# Patient Record
Sex: Female | Born: 1950
Health system: Southern US, Community
[De-identification: ages and names within clinical notes are randomized; demographics above are authoritative.]

## PROBLEM LIST (undated history)

## (undated) DIAGNOSIS — H269 Unspecified cataract: Secondary | ICD-10-CM

## (undated) DIAGNOSIS — E119 Type 2 diabetes mellitus without complications: Secondary | ICD-10-CM

## (undated) DIAGNOSIS — M1990 Unspecified osteoarthritis, unspecified site: Secondary | ICD-10-CM

## (undated) DIAGNOSIS — F32A Depression, unspecified: Secondary | ICD-10-CM

## (undated) DIAGNOSIS — E785 Hyperlipidemia, unspecified: Secondary | ICD-10-CM

## (undated) DIAGNOSIS — I10 Essential (primary) hypertension: Secondary | ICD-10-CM

## (undated) DIAGNOSIS — F329 Major depressive disorder, single episode, unspecified: Secondary | ICD-10-CM

## (undated) HISTORY — DX: Unspecified osteoarthritis, unspecified site: M19.90

## (undated) HISTORY — DX: Essential (primary) hypertension: I10

## (undated) HISTORY — DX: Depression, unspecified: F32.A

## (undated) HISTORY — DX: Major depressive disorder, single episode, unspecified: F32.9

## (undated) HISTORY — DX: Unspecified cataract: H26.9

## (undated) HISTORY — DX: Type 2 diabetes mellitus without complications: E11.9

## (undated) HISTORY — DX: Hyperlipidemia, unspecified: E78.5

---

## 1998-03-19 ENCOUNTER — Emergency Department (HOSPITAL_COMMUNITY): Admission: EM | Admit: 1998-03-19 | Discharge: 1998-03-19 | Payer: Self-pay | Admitting: Emergency Medicine

## 1999-12-13 ENCOUNTER — Encounter: Admission: RE | Admit: 1999-12-13 | Discharge: 1999-12-13 | Payer: Self-pay | Admitting: Surgery

## 1999-12-13 ENCOUNTER — Encounter: Payer: Self-pay | Admitting: Surgery

## 2000-09-29 ENCOUNTER — Encounter: Admission: RE | Admit: 2000-09-29 | Discharge: 2000-09-29 | Payer: Self-pay | Admitting: Family Medicine

## 2000-09-29 ENCOUNTER — Encounter: Payer: Self-pay | Admitting: Family Medicine

## 2000-11-21 ENCOUNTER — Other Ambulatory Visit: Admission: RE | Admit: 2000-11-21 | Discharge: 2000-11-21 | Payer: Self-pay | Admitting: Family Medicine

## 2000-11-24 ENCOUNTER — Encounter: Payer: Self-pay | Admitting: Family Medicine

## 2000-11-24 ENCOUNTER — Encounter: Admission: RE | Admit: 2000-11-24 | Discharge: 2000-11-24 | Payer: Self-pay | Admitting: Family Medicine

## 2001-01-14 ENCOUNTER — Ambulatory Visit (HOSPITAL_COMMUNITY): Admission: RE | Admit: 2001-01-14 | Discharge: 2001-01-14 | Payer: Self-pay | Admitting: Gastroenterology

## 2001-01-14 ENCOUNTER — Encounter (INDEPENDENT_AMBULATORY_CARE_PROVIDER_SITE_OTHER): Payer: Self-pay | Admitting: Specialist

## 2001-01-27 ENCOUNTER — Encounter: Payer: Self-pay | Admitting: Gastroenterology

## 2001-01-27 ENCOUNTER — Encounter: Admission: RE | Admit: 2001-01-27 | Discharge: 2001-01-27 | Payer: Self-pay | Admitting: Gastroenterology

## 2001-02-03 ENCOUNTER — Encounter: Payer: Self-pay | Admitting: Gastroenterology

## 2001-02-03 ENCOUNTER — Encounter: Admission: RE | Admit: 2001-02-03 | Discharge: 2001-02-03 | Payer: Self-pay | Admitting: Gastroenterology

## 2002-01-19 ENCOUNTER — Encounter: Payer: Self-pay | Admitting: Family Medicine

## 2002-01-19 ENCOUNTER — Encounter: Admission: RE | Admit: 2002-01-19 | Discharge: 2002-01-19 | Payer: Self-pay | Admitting: Family Medicine

## 2004-12-24 ENCOUNTER — Other Ambulatory Visit: Admission: RE | Admit: 2004-12-24 | Discharge: 2004-12-24 | Payer: Self-pay | Admitting: Family Medicine

## 2005-04-16 ENCOUNTER — Encounter: Admission: RE | Admit: 2005-04-16 | Discharge: 2005-04-16 | Payer: Self-pay | Admitting: Family Medicine

## 2008-12-27 ENCOUNTER — Encounter: Admission: RE | Admit: 2008-12-27 | Discharge: 2008-12-27 | Payer: Self-pay | Admitting: General Practice

## 2011-04-26 NOTE — Procedures (Signed)
[place]. [facility]  Patient:    Rachael Lane, Rachael Lane                       MRN: [id number] Proc. Date: 01/14/01 Adm. Date:  [id number] Attending:  Charna Elizabeth CC:         Talmadge Coventry, M.D.   Procedure Report  DATE OF BIRTH:  August 29, 1951.  REFERRING PHYSICIAN:  Talmadge Coventry, M.D.  PROCEDURE PERFORMED:  Esophagogastroduodenoscopy with biopsies.  ENDOSCOPIST:  Anselmo Rod, M.D.  INSTRUMENT USED:  Olympus video panendoscope.  INDICATIONS FOR PROCEDURE:  Epigastric pain with abnormal weight loss and guaiac positive stools in a 60 year old white female, rule out peptic ulcer disease, esophagitis, gastritis, etc.  PREPROCEDURE PREPARATION:  Informed consent was procured from the patient. The patient was fasted for eight hours prior to the procedure.  PREPROCEDURE PHYSICAL:  The patient had stable vital signs.  Neck supple. Chest clear to auscultation.  S1, S2 regular.  Abdomen soft with normal abdominal bowel sounds.  Epigastric tenderness on palpation with no rebound or rigidity, minimal guarding present.  DESCRIPTION OF PROCEDURE:  The patient was placed in left lateral decubitus position and sedated with 80 mg of Demerol and 10 mg of Versed intravenously. Once the patient was adequately sedated and maintained on low-flow oxygen and continuous cardiac monitoring, the Olympus video panendoscope was advanced through the mouthpiece, over the tongue, into the esophagus under direct vision.  The entire esophagus appeared normal without evidence of ring, stricture, masses, lesions, esophagitis or Barretts mucosa.  The scope was then advanced to the stomach.  There were edematous gastric folds that were biopsied for pathology.  There was patchy hemorrhagic gastritis throughout the gastric mucosa that was noticed, especially in the proximal half of the stomach.  This area was also biopsied to rule out presence of Helicobacter pylori by  pathology.  The duodenal bulb and small bowel distal to the bulb appeared normal.  IMPRESSION: 1. Normal-appearing esophagus and proximal small bowel. 2. Edematous gastric folds with hemorrhagic gastritis in patches.  Biopsies    done, results pending.  RECOMMENDATION: 1. Await pathology results. 2. Proceed with colonoscopy at this time. 3. Outpatient follow-up in the next four weeks. DD:  01/14/01 TD:  01/15/01 Job: [id number]

## 2011-04-26 NOTE — Procedures (Signed)
[place]. [facility]  Patient:    CHASE, KNEBEL                       MRN: [id number] Proc. Date: 01/14/01 Adm. Date:  [id number] Attending:  Charna Elizabeth CC:         Talmadge Coventry, M.D.   Procedure Report  DATE OF BIRTH: 16-Apr-1951  REFERRING PHYSICIAN:  Talmadge Coventry, M.D.  PROCEDURE PERFORMED:  Colonoscopy with snare polypectomy x  1.  ENDOSCOPIST:  Anselmo Rod, M.D.  INSTRUMENT USED:  Olympus video colonoscope.  INDICATIONS FOR PROCEDURE:  Abnormal weight loss with guaiac positive stools in a 60 year old white female, rule out colonic polyps, masses, hemorrhoids, etc.  PREPROCEDURE PREPARATION:  Informed consent was procured from the patient. The patient was fasted for eight hours prior to the procedure and prepped with a bottle of magnesium citrate and a gallon of NuLytely the night prior to the procedure.  PREPROCEDURE PHYSICAL:  The patient had stable vital signs.  Neck supple. Chest clear to auscultation.  S1, S2 regular.  Abdomen soft with normal abdominal bowel sounds.  DESCRIPTION OF PROCEDURE:  The patient was placed in the left lateral decubitus position and sedated with 20 mg of Demerol in addition to the sedation she had received for her EGD.  Once the patient was adequately positioned, the Olympus video colonoscope was advanced from the rectum to the cecum with slight difficulty secondary to a large amount of residual stool in the colon.  Multiple washes were done.  One small polyp was snared from 40 cm. The patient also had small nonbleeding internal hemorrhoids.  No large masses or polyps were seen.  There was no evidence of diverticulosis.  Small lesions may have been missed secondary to the inadequate prep.  IMPRESSION: 1. Small polyp snared at 40 cm. 2. Small nonbleeding internal hemorrhoid. 3. Large amount of residual stool in the colon.  A very small lesion may have    been missed.  No large masses  or polyps present.  RECOMMENDATIONS: 1. Await pathology results.  Avoid all nonsteroidals for now.  Follow up    on CT scan results of the abdomen and pelvis. 2. Small bowel follow-through to complete the GI evaluation. 3. Outpatient follow-up in the next four weeks.DD:  01/14/01 TD:  01/15/01 Job: [id number]

## 2013-09-30 ENCOUNTER — Ambulatory Visit (INDEPENDENT_AMBULATORY_CARE_PROVIDER_SITE_OTHER): Payer: Medicare HMO

## 2013-09-30 ENCOUNTER — Encounter (INDEPENDENT_AMBULATORY_CARE_PROVIDER_SITE_OTHER): Payer: Self-pay

## 2013-09-30 VITALS — BP 125/81 | HR 74 | Resp 16

## 2013-09-30 DIAGNOSIS — B351 Tinea unguium: Secondary | ICD-10-CM

## 2013-09-30 DIAGNOSIS — E119 Type 2 diabetes mellitus without complications: Secondary | ICD-10-CM

## 2013-09-30 DIAGNOSIS — B353 Tinea pedis: Secondary | ICD-10-CM

## 2013-09-30 MED ORDER — CLOTRIMAZOLE 1 % EX CREA: TOPICAL_CREAM | Freq: Two times a day (BID) | CUTANEOUS | Status: DC

## 2013-09-30 NOTE — Progress Notes (Signed)
  Subjective:    Patient ID: Rachael Lane. Epling, female    DOB: [birth year], 62 y.o.   MRN: [id number] Feet have been hurting for two months and they are cracking and sore and peeling  And has been going all summer long and burns and throbs and numbess and tingling and change temperatures and my nail on big toenail is discolored and long HPI patient has recently diagnosed diabetes taking oral medications. However for the last month does have burning stinging with a moccasin distribution of the macular rash plantar aspect of both feet. Patient also has yellowing and discoloration and arthrosis both hallux nails. Lesser nails also shows yellowing nonpainful or symptomatic    Review of Systems  Constitutional: Negative.   HENT: Negative.   Respiratory: Negative.   Cardiovascular: Negative.   Gastrointestinal: Negative.   Endocrine: Negative.   Genitourinary: Negative.   Neurological: Negative.   Hematological: Negative.        Objective:   Physical Exam  Constitutional: She is oriented to person, place, and time. She appears well-developed and well-nourished.  Cardiovascular:  Pulses:      Dorsalis pedis pulses are 2+ on the right side, and 2+ on the left side.       Posterior tibial pulses are 2+ on the right side, and 2+ on the left side.  Capillary refill time 3 seconds all digits. Skin temperature warm bilateral. No rubor or varicosities noted.  Musculoskeletal:  Rectus foot type bilateral mild digital contractures 2 through 5.  Neurological: She is alert and oriented to person, place, and time. She has normal strength and normal reflexes.  Epicritic and proprioceptive sensations intact and symmetric bilateral. Normal plantar response. Patient describing some burning paresthesias in both feet also associated with a macular rash and my suspicion of both feet.  Skin: Skin is warm and dry. No cyanosis. Nails show no clubbing.  Skin color and pigment normal hair growth present but diminished  distally nails both hallux thick and criptotic discolored yellow with lysis from the nailbed remaining nails yellowed in discolored. The macular rash in a moccasin distribution with pruritus x1 month history.  Psychiatric: She has a normal mood and affect. Her behavior is normal.          Assessment & Plan:  Assessment diabetes yearly diagnosis with likely no neuropathy or angiopathy associated at this time. There is onychomycosis affecting the nails in particular both hallux with thickening kyphosis discoloration. There is also tinea pedis a moccasin distribution bilateral.  Recommend topical formula 3 to be applied twice daily to the affected nails as instructed. Prescription for clotrimazole cream twice daily for a one-month duration with refills. Recheck in 2-3 months for follow or on an as-needed basis if it fails to improve. Dispensed literature about diabetic foot care as well may recommendations appropriate about appropriate diabetic shoe wear.  Alvan Dame DP

## 2013-09-30 NOTE — Patient Instructions (Signed)
Onychomycosis/Fungal Toenails  WHAT IS IT? An infection that lies within the keratin of your nail plate that is caused by a fungus.  WHY ME? Fungal infections affect all ages, sexes, races, and creeds.  There may be many factors that predispose you to a fungal infection such as age, coexisting medical conditions such as diabetes, or an autoimmune disease; stress, medications, fatigue, genetics, etc.  Bottom line: fungus thrives in a warm, moist environment and your shoes offer such a location.  IS IT CONTAGIOUS? Theoretically, yes.  You do not want to share shoes, nail clippers or files with someone who has fungal toenails.  Walking around barefoot in the same room or sleeping in the same bed is unlikely to transfer the organism.  It is important to realize, however, that fungus can spread easily from one nail to the next on the same foot.  HOW DO WE TREAT THIS?  There are several ways to treat this condition.  Treatment may depend on many factors such as age, medications, pregnancy, liver and kidney conditions, etc.  It is best to ask your doctor which options are available to you.  1. No treatment.   Unlike many other medical concerns, you can live with this condition.  However for many people this can be a painful condition and may lead to ingrown toenails or a bacterial infection.  It is recommended that you keep the nails cut short to help reduce the amount of fungal nail. 2. Topical treatment.  These range from herbal remedies to prescription strength nail lacquers.  About 40-50% effective, topicals require twice daily application for approximately 9 to 12 months or until an entirely new nail has grown out.  The most effective topicals are medical grade medications available through physicians offices. 3. Oral antifungal medications.  With an 80-90% cure rate, the most common oral medication requires 3 to 4 months of therapy and stays in your system for a year as the new nail grows out.  Oral  antifungal medications do require blood work to make sure it is a safe drug for you.  A liver function panel will be performed prior to starting the medication and after the first month of treatment.  It is important to have the blood work performed to avoid any harmful side effects.  In general, this medication safe but blood work is required. 4. Laser Therapy.  This treatment is performed by applying a specialized laser to the affected nail plate.  This therapy is noninvasive, fast, and non-painful.  It is not covered by insurance and is therefore, out of pocket.  The results have been very good with a 80-95% cure rate.  The [facility] is the only practice in the area to offer this therapy. Permanent Nail Avulsion.  Removing the entire nail so that a new nail will not grow back     .Diabetes and Foot Care Diabetes may cause you to have a poor blood supply (circulation) to your legs and feet. Because of this, the skin may be thinner, break easier, and heal more slowly. You also may have nerve damage in your legs and feet causing decreased feeling. You may not notice minor injuries to your feet that could lead to serious problems or infections. Taking care of your feet is one of the most important things you can do for yourself.  HOME CARE INSTRUCTIONS  Do not go barefoot. Bare feet are easily injured.  Check your feet daily for blisters, cuts, and redness.    Wash your feet with warm water (not hot) and mild soap. Pat your feet and between your toes until completely dry.  Apply a moisturizing lotion that does not contain alcohol or petroleum jelly to the dry skin on your feet and to dry brittle toenails. Do not put it between your toes.  Trim your toenails straight across. Do not dig under them or around the cuticle.  Do not cut corns or calluses, or try to remove them with medicine.  Wear clean cotton socks or stockings every day. Make sure they are not too tight. Do not wear knee high  stockings since they may decrease blood flow to your legs.  Wear leather shoes that fit properly and have enough cushioning. To break in new shoes, wear them just a few hours a day to avoid injuring your feet.  Wear shoes at all times, even in the house.  Do not cross your legs. This may decrease the blood flow to your feet.  If you find a minor scrape, cut, or break in the skin on your feet, keep it and the skin around it clean and dry. These areas may be cleansed with mild soap and water. Do not use peroxide, alcohol, iodine or Merthiolate.  When you remove an adhesive bandage, be sure not to harm the skin around it.  If you have a wound, look at it several times a day to make sure it is healing.  Do not use heating pads or hot water bottles. Burns can occur. If you have lost feeling in your feet or legs, you may not know it is happening until it is too late.  Report any cuts, sores or bruises to your caregiver. Do not wait! SEEK MEDICAL CARE IF:   You have an injury that is not healing or you notice redness, numbness, burning, or tingling.  Your feet always feel cold.  You have pain or cramps in your legs and feet. SEEK IMMEDIATE MEDICAL CARE IF:   There is increasing redness, swelling, or increasing pain in the wound.  There is a red line that goes up your leg.  Pus is coming from a wound.  You develop an unexplained oral temperature above 102 F (38.9 C), or as your caregiver suggests.  You notice a bad smell coming from an ulcer or wound. MAKE SURE YOU:   Understand these instructions.  Will watch your condition.  Will get help right away if you are not doing well or get worse. Document Released: 11/22/2000 Document Revised: 02/17/2012 Document Reviewed: 05/31/2009 ExitCare Patient Information 2014 [employer].  

## 2013-10-13 ENCOUNTER — Telehealth: Payer: Self-pay | Admitting: *Deleted

## 2013-10-13 NOTE — Telephone Encounter (Signed)
Rachael Lane states the rx of 09/30/2013 was not at the pharmacy.  I left message that I would send it electronically, and please let me know if she got it.  REcheck pt's pharmacy name and none was listed, I left 2nd message to call with the pharmacy name and phone number.  I apologized.

## 2013-10-14 MED ORDER — CLOTRIMAZOLE 1 % EX CREA: TOPICAL_CREAM | Freq: Two times a day (BID) | CUTANEOUS | Status: DC

## 2015-06-07 ENCOUNTER — Encounter: Payer: Self-pay | Admitting: Podiatry

## 2015-06-07 ENCOUNTER — Ambulatory Visit (INDEPENDENT_AMBULATORY_CARE_PROVIDER_SITE_OTHER): Payer: Commercial Managed Care - HMO | Admitting: Podiatry

## 2015-06-07 VITALS — BP 121/83 | HR 68 | Resp 18

## 2015-06-07 DIAGNOSIS — E119 Type 2 diabetes mellitus without complications: Secondary | ICD-10-CM | POA: Diagnosis not present

## 2015-06-07 DIAGNOSIS — B351 Tinea unguium: Secondary | ICD-10-CM | POA: Diagnosis not present

## 2015-06-07 NOTE — Progress Notes (Signed)
Patient ID: Rachael Lane, female   DOB: Jan 14, 1951, 64 y.o.   MRN: [id number] Complaint:  Visit Type: Patient returns to my office for continued preventative foot care services. Complaint: Patient states" my nails have grown long and thick and become painful to walk and wear shoes" Patient has been diagnosed with DM with no complications. He presents for preventative foot care services. No changes to ROS  Podiatric Exam: Vascular: dorsalis pedis and posterior tibial pulses are palpable bilateral. Capillary return is immediate. Temperature gradient is WNL. Skin turgor WNL  Sensorium: Normal Semmes Weinstein monofilament test. Normal tactile sensation bilaterally. Nail Exam: Pt has thick disfigured discolored nails with subungual debris noted bilateral entire nail hallux through fifth toenails Ulcer Exam: There is no evidence of ulcer or pre-ulcerative changes or infection. Orthopedic Exam: Muscle tone and strength are WNL. No limitations in general ROM. No crepitus or effusions noted. Foot type and digits show no abnormalities. Bony prominences are unremarkable. Skin: No Porokeratosis. No infection or ulcers.  Dry scaly fizzuring skin heels both feet.  Diagnosis:  Tinea unguium, Pain in right toe, pain in left toes  Treatment & Plan Procedures and Treatment: Consent by patient was obtained for treatment procedures. The patient understood the discussion of treatment and procedures well. All questions were answered thoroughly reviewed. Debridement of mycotic and hypertrophic toenails, 1 through 5 bilateral and clearing of subungual debris. No ulceration, no infection noted.  Return Visit-Office Procedure: Patient instructed to return to the office for a follow up visit 3 months for continued evaluation and treatment. Told her to continue with vaseline.  Told her to pick up 3/4 spenco orthoses for forefoot cushion for her feet.

## 2015-09-13 ENCOUNTER — Ambulatory Visit: Payer: Commercial Managed Care - HMO | Admitting: Sports Medicine

## 2016-01-05 DIAGNOSIS — Z1231 Encounter for screening mammogram for malignant neoplasm of breast: Secondary | ICD-10-CM | POA: Diagnosis not present

## 2016-03-05 DIAGNOSIS — F3342 Major depressive disorder, recurrent, in full remission: Secondary | ICD-10-CM | POA: Diagnosis not present

## 2016-04-22 DIAGNOSIS — E669 Obesity, unspecified: Secondary | ICD-10-CM | POA: Diagnosis not present

## 2016-04-22 DIAGNOSIS — Z6831 Body mass index (BMI) 31.0-31.9, adult: Secondary | ICD-10-CM | POA: Diagnosis not present

## 2016-04-22 DIAGNOSIS — R300 Dysuria: Secondary | ICD-10-CM | POA: Diagnosis not present

## 2016-04-22 DIAGNOSIS — R42 Dizziness and giddiness: Secondary | ICD-10-CM | POA: Diagnosis not present

## 2016-04-22 DIAGNOSIS — N390 Urinary tract infection, site not specified: Secondary | ICD-10-CM | POA: Diagnosis not present

## 2016-04-22 DIAGNOSIS — Z79899 Other long term (current) drug therapy: Secondary | ICD-10-CM | POA: Diagnosis not present

## 2016-06-10 DIAGNOSIS — L821 Other seborrheic keratosis: Secondary | ICD-10-CM | POA: Diagnosis not present

## 2016-06-10 DIAGNOSIS — Z79899 Other long term (current) drug therapy: Secondary | ICD-10-CM | POA: Diagnosis not present

## 2016-06-10 DIAGNOSIS — I10 Essential (primary) hypertension: Secondary | ICD-10-CM | POA: Diagnosis not present

## 2016-06-10 DIAGNOSIS — E1165 Type 2 diabetes mellitus with hyperglycemia: Secondary | ICD-10-CM | POA: Diagnosis not present

## 2016-06-10 DIAGNOSIS — Z6831 Body mass index (BMI) 31.0-31.9, adult: Secondary | ICD-10-CM | POA: Diagnosis not present

## 2016-06-10 DIAGNOSIS — E782 Mixed hyperlipidemia: Secondary | ICD-10-CM | POA: Diagnosis not present

## 2016-06-10 DIAGNOSIS — G629 Polyneuropathy, unspecified: Secondary | ICD-10-CM | POA: Diagnosis not present

## 2016-06-28 DIAGNOSIS — R0602 Shortness of breath: Secondary | ICD-10-CM | POA: Diagnosis not present

## 2016-06-28 DIAGNOSIS — R062 Wheezing: Secondary | ICD-10-CM | POA: Diagnosis not present

## 2016-06-28 DIAGNOSIS — J209 Acute bronchitis, unspecified: Secondary | ICD-10-CM | POA: Diagnosis not present

## 2016-07-03 DIAGNOSIS — Z6830 Body mass index (BMI) 30.0-30.9, adult: Secondary | ICD-10-CM | POA: Diagnosis not present

## 2016-07-03 DIAGNOSIS — R05 Cough: Secondary | ICD-10-CM | POA: Diagnosis not present

## 2016-07-03 DIAGNOSIS — M25569 Pain in unspecified knee: Secondary | ICD-10-CM | POA: Diagnosis not present

## 2016-07-03 DIAGNOSIS — E1165 Type 2 diabetes mellitus with hyperglycemia: Secondary | ICD-10-CM | POA: Diagnosis not present

## 2016-07-03 DIAGNOSIS — I10 Essential (primary) hypertension: Secondary | ICD-10-CM | POA: Diagnosis not present

## 2016-09-10 DIAGNOSIS — R413 Other amnesia: Secondary | ICD-10-CM | POA: Diagnosis not present

## 2016-09-10 DIAGNOSIS — Z79899 Other long term (current) drug therapy: Secondary | ICD-10-CM | POA: Diagnosis not present

## 2016-09-10 DIAGNOSIS — M25561 Pain in right knee: Secondary | ICD-10-CM | POA: Diagnosis not present

## 2016-09-10 DIAGNOSIS — E669 Obesity, unspecified: Secondary | ICD-10-CM | POA: Diagnosis not present

## 2016-09-10 DIAGNOSIS — E1165 Type 2 diabetes mellitus with hyperglycemia: Secondary | ICD-10-CM | POA: Diagnosis not present

## 2016-09-10 DIAGNOSIS — Z6831 Body mass index (BMI) 31.0-31.9, adult: Secondary | ICD-10-CM | POA: Diagnosis not present

## 2016-09-10 DIAGNOSIS — G629 Polyneuropathy, unspecified: Secondary | ICD-10-CM | POA: Diagnosis not present

## 2016-09-10 DIAGNOSIS — Z9181 History of falling: Secondary | ICD-10-CM | POA: Diagnosis not present

## 2016-09-10 DIAGNOSIS — I10 Essential (primary) hypertension: Secondary | ICD-10-CM | POA: Diagnosis not present

## 2016-09-10 DIAGNOSIS — E782 Mixed hyperlipidemia: Secondary | ICD-10-CM | POA: Diagnosis not present

## 2016-09-17 DIAGNOSIS — M170 Bilateral primary osteoarthritis of knee: Secondary | ICD-10-CM | POA: Diagnosis not present

## 2016-10-21 DIAGNOSIS — H26053 Posterior subcapsular polar infantile and juvenile cataract, bilateral: Secondary | ICD-10-CM | POA: Diagnosis not present

## 2016-10-21 DIAGNOSIS — H524 Presbyopia: Secondary | ICD-10-CM | POA: Diagnosis not present

## 2016-10-21 DIAGNOSIS — E119 Type 2 diabetes mellitus without complications: Secondary | ICD-10-CM | POA: Diagnosis not present

## 2016-12-12 ENCOUNTER — Telehealth: Payer: Self-pay | Admitting: *Deleted

## 2016-12-12 ENCOUNTER — Ambulatory Visit: Payer: Commercial Managed Care - HMO | Admitting: Sports Medicine

## 2016-12-12 NOTE — Telephone Encounter (Signed)
Pt states her area is so covered in bad weather she can't get in today and has been changed to 12/13/2016 at 11:00am, and her toe is swollen and infected, what can she do until her appt. I instructed pt to do 1/2C epsom salt and 1Qt warm water soaks twice daily for 20 minutes then cover with a antibiotic ointment bandaid until she is seen tomorrow. Pt states understanding.

## 2016-12-13 ENCOUNTER — Ambulatory Visit (INDEPENDENT_AMBULATORY_CARE_PROVIDER_SITE_OTHER): Payer: PPO | Admitting: Sports Medicine

## 2016-12-13 ENCOUNTER — Encounter: Payer: Self-pay | Admitting: Sports Medicine

## 2016-12-13 DIAGNOSIS — M79674 Pain in right toe(s): Secondary | ICD-10-CM

## 2016-12-13 DIAGNOSIS — E1142 Type 2 diabetes mellitus with diabetic polyneuropathy: Secondary | ICD-10-CM | POA: Diagnosis not present

## 2016-12-13 DIAGNOSIS — M79675 Pain in left toe(s): Secondary | ICD-10-CM

## 2016-12-13 DIAGNOSIS — L03031 Cellulitis of right toe: Secondary | ICD-10-CM | POA: Diagnosis not present

## 2016-12-13 MED ORDER — AMOXICILLIN-POT CLAVULANATE 875-125 MG PO TABS: 1.0000 | ORAL_TABLET | Freq: Two times a day (BID) | ORAL | 0 refills | Status: DC

## 2016-12-13 NOTE — Progress Notes (Signed)
Subjective: Rachael Lane is a 66 y.o. diabetic female patient presents to office today complaining of a painful incurvated, red, hot, swollen medial nail border of the third toe on the right foot. This has been present for a few days Patient has treated this by soaking with no improvement. Patient denies fever/chills/nausea/vomitting/any other related constitutional symptoms at this time.  Fasting blood sugar 120  There are no active problems to display for this patient.   Current Outpatient Prescriptions on File Prior to Visit  Medication Sig Dispense Refill  . ACCU-CHEK AVIVA PLUS test strip     . AFLURIA PRESERVATIVE FREE injection     . ciprofloxacin (CIPRO) 500 MG tablet     . clotrimazole (CLOTRIMAZOLE ANTI-FUNGAL) 1 % cream Apply topically 2 (two) times daily. 30 g 3  . FLUoxetine (PROZAC) 20 MG capsule     . gabapentin (NEURONTIN) 100 MG capsule     . JARDIANCE 25 MG TABS tablet     . lisinopril (PRINIVIL,ZESTRIL) 5 MG tablet     . meloxicam (MOBIC) 7.5 MG tablet     . omeprazole (PRILOSEC) 40 MG capsule     . oxyCODONE-acetaminophen (PERCOCET) 7.5-325 MG per tablet     . propranolol ER (INDERAL LA) 80 MG 24 hr capsule     . simvastatin (ZOCOR) 40 MG tablet     . temazepam (RESTORIL) 15 MG capsule     . terbinafine (LAMISIL) 250 MG tablet     . traMADol (ULTRAM) 50 MG tablet     . traZODone (DESYREL) 150 MG tablet      No current facility-administered medications on file prior to visit.     No Known Allergies  Objective:  There were no vitals filed for this visit.  General: Well developed, nourished, in no acute distress, alert and oriented x3   Dermatology: Skin is warm, dry and supple bilateral. Right third toe nail appears to be severely incurvated with hyperkeratosis formation at the distal aspects of the medial  nail border. (+) Erythema. (+) Edema. (-) serosanguous drainage present. The remaining nails appear unremarkable at this time. There are no open sores,  lesions or other signs of infection  present.  Vascular: Dorsalis Pedis artery and Posterior Tibial artery pedal pulses are 1/4 bilateral with immedate capillary fill time. Pedal hair growth present. No lower extremity edema.   Neruologic: Grossly intact via light touch bilateral. Subjective burning to bottoms of both feet, however protective and vibratory sensation is intact bilateral.  Musculoskeletal: Tenderness to palpation of the right third medial nail fold. Muscular strength within normal limits in all groups bilateral.   Assesement and Plan: Problem List Items Addressed This Visit    None    Visit Diagnoses    Paronychia of third toe of right foot    -  Primary   Relevant Medications   amoxicillin-clavulanate (AUGMENTIN) 875-125 MG tablet   Toe pain, bilateral       Relevant Medications   amoxicillin-clavulanate (AUGMENTIN) 875-125 MG tablet   Diabetic polyneuropathy associated with type 2 diabetes mellitus (HCC)       Relevant Medications   amoxicillin-clavulanate (AUGMENTIN) 875-125 MG tablet     -Discussed treatment alternatives and plan of care; Explained permanent/temporary nail avulsion and post procedure course to patient. - After a verbal consent, injected 3 ml of a 50:50 mixture of 2% plain  lidocaine and 0.5% plain marcaine in a normal Digital block fashion. Next, a betadine prep was performed. Anesthesia was tested and  found to be appropriate.  The offending right third toe, medial nail border was then incised from the hyponychium to the epinychium. The offending nail border was removed and cleared from the field. The area was curretted for any remaining nail or spicules. Phenol application performed and the area was then flushed with alcohol and dressed with antibiotic cream and a dry sterile dressing. -Patient was instructed to leave the dressing intact for today and begin soaking in a weak solution of betadine or Epsom salt and water tomorrow. Patient was instructed  to soak for 15 minutes each day and apply neosporin and a gauze or bandaid dressing each day. -Patient was instructed to monitor the toe for signs of infection and return to office if toe becomes red, hot or swollen. -Prescribed Augmentin 875 twice a day for preventative measures in the setting of diabetes with paronychia -Advised ice, elevation, and tylenol or motrin if needed for pain.  -Patient is to return in 2 weeks for follow up care/nail check or sooner if problems arise. Advised patient that if burning in feet still persist, we'll talk about other treatment options and talk about her coming for routine diabetic nail care, so that way. Patient will not have to worry about trimming her own nails, which could have been the cause of this ingrown toenail, infection.  Asencion Islamitorya Talita Recht, DPM

## 2016-12-13 NOTE — Patient Instructions (Signed)

## 2016-12-17 DIAGNOSIS — N390 Urinary tract infection, site not specified: Secondary | ICD-10-CM | POA: Diagnosis not present

## 2016-12-17 DIAGNOSIS — Z6832 Body mass index (BMI) 32.0-32.9, adult: Secondary | ICD-10-CM | POA: Diagnosis not present

## 2016-12-17 DIAGNOSIS — Z79899 Other long term (current) drug therapy: Secondary | ICD-10-CM | POA: Diagnosis not present

## 2016-12-27 ENCOUNTER — Ambulatory Visit: Payer: PPO | Admitting: Sports Medicine

## 2016-12-31 DIAGNOSIS — Z79899 Other long term (current) drug therapy: Secondary | ICD-10-CM | POA: Diagnosis not present

## 2016-12-31 DIAGNOSIS — E782 Mixed hyperlipidemia: Secondary | ICD-10-CM | POA: Diagnosis not present

## 2016-12-31 DIAGNOSIS — E1165 Type 2 diabetes mellitus with hyperglycemia: Secondary | ICD-10-CM | POA: Diagnosis not present

## 2016-12-31 DIAGNOSIS — R413 Other amnesia: Secondary | ICD-10-CM | POA: Diagnosis not present

## 2016-12-31 DIAGNOSIS — Z6832 Body mass index (BMI) 32.0-32.9, adult: Secondary | ICD-10-CM | POA: Diagnosis not present

## 2016-12-31 DIAGNOSIS — E669 Obesity, unspecified: Secondary | ICD-10-CM | POA: Diagnosis not present

## 2016-12-31 DIAGNOSIS — R232 Flushing: Secondary | ICD-10-CM | POA: Diagnosis not present

## 2017-01-07 DIAGNOSIS — M1711 Unilateral primary osteoarthritis, right knee: Secondary | ICD-10-CM | POA: Diagnosis not present

## 2017-01-14 DIAGNOSIS — M1712 Unilateral primary osteoarthritis, left knee: Secondary | ICD-10-CM | POA: Diagnosis not present

## 2017-01-31 DIAGNOSIS — J209 Acute bronchitis, unspecified: Secondary | ICD-10-CM | POA: Diagnosis not present

## 2017-01-31 DIAGNOSIS — J0100 Acute maxillary sinusitis, unspecified: Secondary | ICD-10-CM | POA: Diagnosis not present

## 2017-01-31 DIAGNOSIS — E1165 Type 2 diabetes mellitus with hyperglycemia: Secondary | ICD-10-CM | POA: Diagnosis not present

## 2017-02-11 DIAGNOSIS — I10 Essential (primary) hypertension: Secondary | ICD-10-CM | POA: Diagnosis not present

## 2017-02-11 DIAGNOSIS — R05 Cough: Secondary | ICD-10-CM | POA: Diagnosis not present

## 2017-02-11 DIAGNOSIS — J3089 Other allergic rhinitis: Secondary | ICD-10-CM | POA: Diagnosis not present

## 2017-02-11 DIAGNOSIS — Z6831 Body mass index (BMI) 31.0-31.9, adult: Secondary | ICD-10-CM | POA: Diagnosis not present

## 2017-02-12 DIAGNOSIS — I10 Essential (primary) hypertension: Secondary | ICD-10-CM | POA: Diagnosis not present

## 2017-02-12 DIAGNOSIS — Z6831 Body mass index (BMI) 31.0-31.9, adult: Secondary | ICD-10-CM | POA: Diagnosis not present

## 2017-02-12 DIAGNOSIS — J3089 Other allergic rhinitis: Secondary | ICD-10-CM | POA: Diagnosis not present

## 2017-02-12 DIAGNOSIS — R05 Cough: Secondary | ICD-10-CM | POA: Diagnosis not present

## 2017-02-19 DIAGNOSIS — K219 Gastro-esophageal reflux disease without esophagitis: Secondary | ICD-10-CM | POA: Diagnosis not present

## 2017-02-19 DIAGNOSIS — J3089 Other allergic rhinitis: Secondary | ICD-10-CM | POA: Diagnosis not present

## 2017-02-19 DIAGNOSIS — R413 Other amnesia: Secondary | ICD-10-CM | POA: Diagnosis not present

## 2017-02-19 DIAGNOSIS — R05 Cough: Secondary | ICD-10-CM | POA: Diagnosis not present

## 2017-02-19 DIAGNOSIS — Z6832 Body mass index (BMI) 32.0-32.9, adult: Secondary | ICD-10-CM | POA: Diagnosis not present

## 2017-02-19 DIAGNOSIS — R232 Flushing: Secondary | ICD-10-CM | POA: Diagnosis not present

## 2017-02-19 DIAGNOSIS — I10 Essential (primary) hypertension: Secondary | ICD-10-CM | POA: Diagnosis not present

## 2017-02-19 DIAGNOSIS — F419 Anxiety disorder, unspecified: Secondary | ICD-10-CM | POA: Diagnosis not present

## 2017-02-19 DIAGNOSIS — E782 Mixed hyperlipidemia: Secondary | ICD-10-CM | POA: Diagnosis not present

## 2017-02-19 DIAGNOSIS — E1165 Type 2 diabetes mellitus with hyperglycemia: Secondary | ICD-10-CM | POA: Diagnosis not present

## 2017-02-19 DIAGNOSIS — M1990 Unspecified osteoarthritis, unspecified site: Secondary | ICD-10-CM | POA: Diagnosis not present

## 2017-02-21 DIAGNOSIS — K625 Hemorrhage of anus and rectum: Secondary | ICD-10-CM | POA: Diagnosis not present

## 2017-02-21 DIAGNOSIS — K649 Unspecified hemorrhoids: Secondary | ICD-10-CM | POA: Diagnosis not present

## 2017-02-24 DIAGNOSIS — K5909 Other constipation: Secondary | ICD-10-CM | POA: Diagnosis not present

## 2017-02-24 DIAGNOSIS — K649 Unspecified hemorrhoids: Secondary | ICD-10-CM | POA: Diagnosis not present

## 2017-02-24 DIAGNOSIS — Z6832 Body mass index (BMI) 32.0-32.9, adult: Secondary | ICD-10-CM | POA: Diagnosis not present

## 2017-02-24 DIAGNOSIS — I10 Essential (primary) hypertension: Secondary | ICD-10-CM | POA: Diagnosis not present

## 2017-03-03 DIAGNOSIS — Z6832 Body mass index (BMI) 32.0-32.9, adult: Secondary | ICD-10-CM | POA: Diagnosis not present

## 2017-03-03 DIAGNOSIS — I7300 Raynaud's syndrome without gangrene: Secondary | ICD-10-CM | POA: Diagnosis not present

## 2017-03-03 DIAGNOSIS — G629 Polyneuropathy, unspecified: Secondary | ICD-10-CM | POA: Diagnosis not present

## 2017-03-03 DIAGNOSIS — E669 Obesity, unspecified: Secondary | ICD-10-CM | POA: Diagnosis not present

## 2017-03-03 DIAGNOSIS — M25512 Pain in left shoulder: Secondary | ICD-10-CM | POA: Diagnosis not present

## 2017-03-11 DIAGNOSIS — Z1211 Encounter for screening for malignant neoplasm of colon: Secondary | ICD-10-CM | POA: Diagnosis not present

## 2017-03-11 DIAGNOSIS — K5900 Constipation, unspecified: Secondary | ICD-10-CM | POA: Diagnosis not present

## 2017-03-13 DIAGNOSIS — K5900 Constipation, unspecified: Secondary | ICD-10-CM | POA: Diagnosis not present

## 2017-04-16 DIAGNOSIS — F39 Unspecified mood [affective] disorder: Secondary | ICD-10-CM | POA: Diagnosis not present

## 2017-05-02 DIAGNOSIS — N3090 Cystitis, unspecified without hematuria: Secondary | ICD-10-CM | POA: Diagnosis not present

## 2017-05-02 DIAGNOSIS — N3001 Acute cystitis with hematuria: Secondary | ICD-10-CM | POA: Diagnosis not present

## 2017-05-07 DIAGNOSIS — Z6831 Body mass index (BMI) 31.0-31.9, adult: Secondary | ICD-10-CM | POA: Diagnosis not present

## 2017-05-07 DIAGNOSIS — E785 Hyperlipidemia, unspecified: Secondary | ICD-10-CM | POA: Diagnosis not present

## 2017-05-07 DIAGNOSIS — E669 Obesity, unspecified: Secondary | ICD-10-CM | POA: Diagnosis not present

## 2017-05-07 DIAGNOSIS — I10 Essential (primary) hypertension: Secondary | ICD-10-CM | POA: Diagnosis not present

## 2017-05-07 DIAGNOSIS — E119 Type 2 diabetes mellitus without complications: Secondary | ICD-10-CM | POA: Diagnosis not present

## 2017-05-09 DIAGNOSIS — H25812 Combined forms of age-related cataract, left eye: Secondary | ICD-10-CM | POA: Diagnosis not present

## 2017-05-09 DIAGNOSIS — H40003 Preglaucoma, unspecified, bilateral: Secondary | ICD-10-CM | POA: Diagnosis not present

## 2017-06-03 DIAGNOSIS — H25812 Combined forms of age-related cataract, left eye: Secondary | ICD-10-CM | POA: Diagnosis not present

## 2017-06-03 DIAGNOSIS — J45909 Unspecified asthma, uncomplicated: Secondary | ICD-10-CM | POA: Diagnosis not present

## 2017-06-03 DIAGNOSIS — Z6831 Body mass index (BMI) 31.0-31.9, adult: Secondary | ICD-10-CM | POA: Diagnosis not present

## 2017-06-03 DIAGNOSIS — Z5309 Procedure and treatment not carried out because of other contraindication: Secondary | ICD-10-CM | POA: Diagnosis not present

## 2017-06-03 DIAGNOSIS — Z79899 Other long term (current) drug therapy: Secondary | ICD-10-CM | POA: Diagnosis not present

## 2017-06-03 DIAGNOSIS — I10 Essential (primary) hypertension: Secondary | ICD-10-CM | POA: Diagnosis not present

## 2017-06-03 DIAGNOSIS — F418 Other specified anxiety disorders: Secondary | ICD-10-CM | POA: Diagnosis not present

## 2017-06-03 DIAGNOSIS — L259 Unspecified contact dermatitis, unspecified cause: Secondary | ICD-10-CM | POA: Diagnosis not present

## 2017-06-03 DIAGNOSIS — E119 Type 2 diabetes mellitus without complications: Secondary | ICD-10-CM | POA: Diagnosis not present

## 2017-06-03 DIAGNOSIS — G629 Polyneuropathy, unspecified: Secondary | ICD-10-CM | POA: Diagnosis not present

## 2017-07-08 DIAGNOSIS — E7800 Pure hypercholesterolemia, unspecified: Secondary | ICD-10-CM | POA: Diagnosis not present

## 2017-07-08 DIAGNOSIS — Z79899 Other long term (current) drug therapy: Secondary | ICD-10-CM | POA: Diagnosis not present

## 2017-07-08 DIAGNOSIS — G629 Polyneuropathy, unspecified: Secondary | ICD-10-CM | POA: Diagnosis not present

## 2017-07-08 DIAGNOSIS — Z7984 Long term (current) use of oral hypoglycemic drugs: Secondary | ICD-10-CM | POA: Diagnosis not present

## 2017-07-08 DIAGNOSIS — H259 Unspecified age-related cataract: Secondary | ICD-10-CM | POA: Diagnosis not present

## 2017-07-08 DIAGNOSIS — H25812 Combined forms of age-related cataract, left eye: Secondary | ICD-10-CM | POA: Diagnosis not present

## 2017-07-08 DIAGNOSIS — E1136 Type 2 diabetes mellitus with diabetic cataract: Secondary | ICD-10-CM | POA: Diagnosis not present

## 2017-07-08 DIAGNOSIS — I10 Essential (primary) hypertension: Secondary | ICD-10-CM | POA: Diagnosis not present

## 2017-07-08 DIAGNOSIS — J45909 Unspecified asthma, uncomplicated: Secondary | ICD-10-CM | POA: Diagnosis not present

## 2017-07-08 DIAGNOSIS — E119 Type 2 diabetes mellitus without complications: Secondary | ICD-10-CM | POA: Diagnosis not present

## 2017-08-05 DIAGNOSIS — J309 Allergic rhinitis, unspecified: Secondary | ICD-10-CM | POA: Diagnosis not present

## 2017-08-05 DIAGNOSIS — F419 Anxiety disorder, unspecified: Secondary | ICD-10-CM | POA: Diagnosis not present

## 2017-08-05 DIAGNOSIS — E1165 Type 2 diabetes mellitus with hyperglycemia: Secondary | ICD-10-CM | POA: Diagnosis not present

## 2017-08-05 DIAGNOSIS — Z79899 Other long term (current) drug therapy: Secondary | ICD-10-CM | POA: Diagnosis not present

## 2017-08-05 DIAGNOSIS — E782 Mixed hyperlipidemia: Secondary | ICD-10-CM | POA: Diagnosis not present

## 2017-08-05 DIAGNOSIS — Z6832 Body mass index (BMI) 32.0-32.9, adult: Secondary | ICD-10-CM | POA: Diagnosis not present

## 2017-08-05 DIAGNOSIS — I10 Essential (primary) hypertension: Secondary | ICD-10-CM | POA: Diagnosis not present

## 2017-08-28 DIAGNOSIS — L959 Vasculitis limited to the skin, unspecified: Secondary | ICD-10-CM | POA: Diagnosis not present

## 2017-08-28 DIAGNOSIS — Z6833 Body mass index (BMI) 33.0-33.9, adult: Secondary | ICD-10-CM | POA: Diagnosis not present

## 2017-08-28 DIAGNOSIS — E669 Obesity, unspecified: Secondary | ICD-10-CM | POA: Diagnosis not present

## 2017-09-20 DIAGNOSIS — M7062 Trochanteric bursitis, left hip: Secondary | ICD-10-CM | POA: Diagnosis not present

## 2017-09-20 DIAGNOSIS — L0100 Impetigo, unspecified: Secondary | ICD-10-CM | POA: Diagnosis not present

## 2017-10-15 DIAGNOSIS — F3342 Major depressive disorder, recurrent, in full remission: Secondary | ICD-10-CM | POA: Diagnosis not present

## 2017-10-24 DIAGNOSIS — L600 Ingrowing nail: Secondary | ICD-10-CM | POA: Diagnosis not present

## 2017-10-24 DIAGNOSIS — Z6833 Body mass index (BMI) 33.0-33.9, adult: Secondary | ICD-10-CM | POA: Diagnosis not present

## 2017-10-29 ENCOUNTER — Ambulatory Visit: Payer: PPO | Admitting: Sports Medicine

## 2017-12-30 DIAGNOSIS — I10 Essential (primary) hypertension: Secondary | ICD-10-CM | POA: Diagnosis not present

## 2017-12-30 DIAGNOSIS — E119 Type 2 diabetes mellitus without complications: Secondary | ICD-10-CM | POA: Diagnosis not present

## 2017-12-30 DIAGNOSIS — M25562 Pain in left knee: Secondary | ICD-10-CM | POA: Diagnosis not present

## 2017-12-30 DIAGNOSIS — M25561 Pain in right knee: Secondary | ICD-10-CM | POA: Diagnosis not present

## 2017-12-30 DIAGNOSIS — R51 Headache: Secondary | ICD-10-CM | POA: Diagnosis not present

## 2018-03-31 DIAGNOSIS — F3342 Major depressive disorder, recurrent, in full remission: Secondary | ICD-10-CM | POA: Diagnosis not present

## 2018-04-02 DIAGNOSIS — Z79899 Other long term (current) drug therapy: Secondary | ICD-10-CM | POA: Diagnosis not present

## 2018-04-02 DIAGNOSIS — I10 Essential (primary) hypertension: Secondary | ICD-10-CM | POA: Diagnosis not present

## 2018-04-02 DIAGNOSIS — Z6834 Body mass index (BMI) 34.0-34.9, adult: Secondary | ICD-10-CM | POA: Diagnosis not present

## 2018-04-02 DIAGNOSIS — J309 Allergic rhinitis, unspecified: Secondary | ICD-10-CM | POA: Diagnosis not present

## 2018-04-02 DIAGNOSIS — E782 Mixed hyperlipidemia: Secondary | ICD-10-CM | POA: Diagnosis not present

## 2018-04-02 DIAGNOSIS — E669 Obesity, unspecified: Secondary | ICD-10-CM | POA: Diagnosis not present

## 2018-04-02 DIAGNOSIS — R413 Other amnesia: Secondary | ICD-10-CM | POA: Diagnosis not present

## 2018-04-02 DIAGNOSIS — E1165 Type 2 diabetes mellitus with hyperglycemia: Secondary | ICD-10-CM | POA: Diagnosis not present

## 2018-05-07 DIAGNOSIS — M1711 Unilateral primary osteoarthritis, right knee: Secondary | ICD-10-CM | POA: Diagnosis not present

## 2018-05-08 DIAGNOSIS — M1711 Unilateral primary osteoarthritis, right knee: Secondary | ICD-10-CM | POA: Diagnosis not present

## 2018-06-03 DIAGNOSIS — F329 Major depressive disorder, single episode, unspecified: Secondary | ICD-10-CM | POA: Diagnosis not present

## 2018-06-03 DIAGNOSIS — Z6835 Body mass index (BMI) 35.0-35.9, adult: Secondary | ICD-10-CM | POA: Diagnosis not present

## 2018-06-03 DIAGNOSIS — E1165 Type 2 diabetes mellitus with hyperglycemia: Secondary | ICD-10-CM | POA: Diagnosis not present

## 2018-08-07 DIAGNOSIS — M1711 Unilateral primary osteoarthritis, right knee: Secondary | ICD-10-CM | POA: Diagnosis not present

## 2018-10-19 DIAGNOSIS — E1165 Type 2 diabetes mellitus with hyperglycemia: Secondary | ICD-10-CM | POA: Diagnosis not present

## 2018-10-19 DIAGNOSIS — F419 Anxiety disorder, unspecified: Secondary | ICD-10-CM | POA: Diagnosis not present

## 2018-10-19 DIAGNOSIS — E782 Mixed hyperlipidemia: Secondary | ICD-10-CM | POA: Diagnosis not present

## 2018-10-19 DIAGNOSIS — Z6835 Body mass index (BMI) 35.0-35.9, adult: Secondary | ICD-10-CM | POA: Diagnosis not present

## 2018-10-19 DIAGNOSIS — Z23 Encounter for immunization: Secondary | ICD-10-CM | POA: Diagnosis not present

## 2018-10-19 DIAGNOSIS — R54 Age-related physical debility: Secondary | ICD-10-CM | POA: Diagnosis not present

## 2018-10-19 DIAGNOSIS — M1990 Unspecified osteoarthritis, unspecified site: Secondary | ICD-10-CM | POA: Diagnosis not present

## 2018-10-19 DIAGNOSIS — I10 Essential (primary) hypertension: Secondary | ICD-10-CM | POA: Diagnosis not present

## 2018-12-23 DIAGNOSIS — G629 Polyneuropathy, unspecified: Secondary | ICD-10-CM | POA: Diagnosis not present

## 2018-12-23 DIAGNOSIS — E1165 Type 2 diabetes mellitus with hyperglycemia: Secondary | ICD-10-CM | POA: Diagnosis not present

## 2018-12-23 DIAGNOSIS — R413 Other amnesia: Secondary | ICD-10-CM | POA: Diagnosis not present

## 2018-12-23 DIAGNOSIS — Z79899 Other long term (current) drug therapy: Secondary | ICD-10-CM | POA: Diagnosis not present

## 2018-12-23 DIAGNOSIS — I10 Essential (primary) hypertension: Secondary | ICD-10-CM | POA: Diagnosis not present

## 2018-12-23 DIAGNOSIS — Z6835 Body mass index (BMI) 35.0-35.9, adult: Secondary | ICD-10-CM | POA: Diagnosis not present

## 2019-03-30 DIAGNOSIS — F3342 Major depressive disorder, recurrent, in full remission: Secondary | ICD-10-CM | POA: Diagnosis not present

## 2019-05-26 DIAGNOSIS — E1165 Type 2 diabetes mellitus with hyperglycemia: Secondary | ICD-10-CM | POA: Diagnosis not present

## 2019-05-26 DIAGNOSIS — E782 Mixed hyperlipidemia: Secondary | ICD-10-CM | POA: Diagnosis not present

## 2019-05-26 DIAGNOSIS — M1990 Unspecified osteoarthritis, unspecified site: Secondary | ICD-10-CM | POA: Diagnosis not present

## 2019-05-26 DIAGNOSIS — Z6835 Body mass index (BMI) 35.0-35.9, adult: Secondary | ICD-10-CM | POA: Diagnosis not present

## 2019-05-26 DIAGNOSIS — K5792 Diverticulitis of intestine, part unspecified, without perforation or abscess without bleeding: Secondary | ICD-10-CM | POA: Diagnosis not present

## 2019-07-28 DIAGNOSIS — Z1211 Encounter for screening for malignant neoplasm of colon: Secondary | ICD-10-CM | POA: Diagnosis not present

## 2019-07-28 DIAGNOSIS — I10 Essential (primary) hypertension: Secondary | ICD-10-CM | POA: Diagnosis not present

## 2019-07-28 DIAGNOSIS — E782 Mixed hyperlipidemia: Secondary | ICD-10-CM | POA: Diagnosis not present

## 2019-07-28 DIAGNOSIS — E669 Obesity, unspecified: Secondary | ICD-10-CM | POA: Diagnosis not present

## 2019-07-28 DIAGNOSIS — K219 Gastro-esophageal reflux disease without esophagitis: Secondary | ICD-10-CM | POA: Diagnosis not present

## 2019-07-28 DIAGNOSIS — Z6834 Body mass index (BMI) 34.0-34.9, adult: Secondary | ICD-10-CM | POA: Diagnosis not present

## 2019-07-28 DIAGNOSIS — E1165 Type 2 diabetes mellitus with hyperglycemia: Secondary | ICD-10-CM | POA: Diagnosis not present

## 2019-07-28 DIAGNOSIS — Z139 Encounter for screening, unspecified: Secondary | ICD-10-CM | POA: Diagnosis not present

## 2019-07-28 DIAGNOSIS — Z79899 Other long term (current) drug therapy: Secondary | ICD-10-CM | POA: Diagnosis not present

## 2019-07-28 DIAGNOSIS — Z9181 History of falling: Secondary | ICD-10-CM | POA: Diagnosis not present

## 2019-09-14 DIAGNOSIS — F3342 Major depressive disorder, recurrent, in full remission: Secondary | ICD-10-CM | POA: Diagnosis not present

## 2020-01-19 DIAGNOSIS — R413 Other amnesia: Secondary | ICD-10-CM | POA: Diagnosis not present

## 2020-01-19 DIAGNOSIS — K219 Gastro-esophageal reflux disease without esophagitis: Secondary | ICD-10-CM | POA: Diagnosis not present

## 2020-01-19 DIAGNOSIS — E669 Obesity, unspecified: Secondary | ICD-10-CM | POA: Diagnosis not present

## 2020-01-19 DIAGNOSIS — Z6834 Body mass index (BMI) 34.0-34.9, adult: Secondary | ICD-10-CM | POA: Diagnosis not present

## 2020-01-19 DIAGNOSIS — Z1211 Encounter for screening for malignant neoplasm of colon: Secondary | ICD-10-CM | POA: Diagnosis not present

## 2020-01-19 DIAGNOSIS — M1990 Unspecified osteoarthritis, unspecified site: Secondary | ICD-10-CM | POA: Diagnosis not present

## 2020-01-19 DIAGNOSIS — Z79899 Other long term (current) drug therapy: Secondary | ICD-10-CM | POA: Diagnosis not present

## 2020-01-19 DIAGNOSIS — E1165 Type 2 diabetes mellitus with hyperglycemia: Secondary | ICD-10-CM | POA: Diagnosis not present

## 2020-01-19 DIAGNOSIS — I10 Essential (primary) hypertension: Secondary | ICD-10-CM | POA: Diagnosis not present

## 2020-01-19 DIAGNOSIS — E782 Mixed hyperlipidemia: Secondary | ICD-10-CM | POA: Diagnosis not present

## 2020-05-13 DIAGNOSIS — F3342 Major depressive disorder, recurrent, in full remission: Secondary | ICD-10-CM | POA: Diagnosis not present

## 2020-05-15 DIAGNOSIS — I10 Essential (primary) hypertension: Secondary | ICD-10-CM | POA: Diagnosis not present

## 2020-05-15 DIAGNOSIS — J309 Allergic rhinitis, unspecified: Secondary | ICD-10-CM | POA: Diagnosis not present

## 2020-05-15 DIAGNOSIS — Z6831 Body mass index (BMI) 31.0-31.9, adult: Secondary | ICD-10-CM | POA: Diagnosis not present

## 2020-05-15 DIAGNOSIS — E782 Mixed hyperlipidemia: Secondary | ICD-10-CM | POA: Diagnosis not present

## 2020-05-15 DIAGNOSIS — R413 Other amnesia: Secondary | ICD-10-CM | POA: Diagnosis not present

## 2020-05-15 DIAGNOSIS — K219 Gastro-esophageal reflux disease without esophagitis: Secondary | ICD-10-CM | POA: Diagnosis not present

## 2020-05-15 DIAGNOSIS — Z79899 Other long term (current) drug therapy: Secondary | ICD-10-CM | POA: Diagnosis not present

## 2020-05-15 DIAGNOSIS — E1165 Type 2 diabetes mellitus with hyperglycemia: Secondary | ICD-10-CM | POA: Diagnosis not present

## 2020-05-15 DIAGNOSIS — K5792 Diverticulitis of intestine, part unspecified, without perforation or abscess without bleeding: Secondary | ICD-10-CM | POA: Diagnosis not present

## 2020-10-26 DIAGNOSIS — I10 Essential (primary) hypertension: Secondary | ICD-10-CM | POA: Diagnosis not present

## 2020-10-26 DIAGNOSIS — G4700 Insomnia, unspecified: Secondary | ICD-10-CM | POA: Diagnosis not present

## 2020-10-26 DIAGNOSIS — Z79899 Other long term (current) drug therapy: Secondary | ICD-10-CM | POA: Diagnosis not present

## 2020-10-26 DIAGNOSIS — G629 Polyneuropathy, unspecified: Secondary | ICD-10-CM | POA: Diagnosis not present

## 2020-10-26 DIAGNOSIS — Z6831 Body mass index (BMI) 31.0-31.9, adult: Secondary | ICD-10-CM | POA: Diagnosis not present

## 2020-10-26 DIAGNOSIS — Z23 Encounter for immunization: Secondary | ICD-10-CM | POA: Diagnosis not present

## 2020-10-26 DIAGNOSIS — R413 Other amnesia: Secondary | ICD-10-CM | POA: Diagnosis not present

## 2020-10-26 DIAGNOSIS — E669 Obesity, unspecified: Secondary | ICD-10-CM | POA: Diagnosis not present

## 2020-10-26 DIAGNOSIS — Z139 Encounter for screening, unspecified: Secondary | ICD-10-CM | POA: Diagnosis not present

## 2020-10-26 DIAGNOSIS — E1165 Type 2 diabetes mellitus with hyperglycemia: Secondary | ICD-10-CM | POA: Diagnosis not present

## 2020-10-26 DIAGNOSIS — E782 Mixed hyperlipidemia: Secondary | ICD-10-CM | POA: Diagnosis not present

## 2020-10-26 DIAGNOSIS — Z9181 History of falling: Secondary | ICD-10-CM | POA: Diagnosis not present

## 2021-02-22 DIAGNOSIS — F3342 Major depressive disorder, recurrent, in full remission: Secondary | ICD-10-CM | POA: Diagnosis not present

## 2021-04-25 DIAGNOSIS — E669 Obesity, unspecified: Secondary | ICD-10-CM | POA: Diagnosis not present

## 2021-04-25 DIAGNOSIS — E1169 Type 2 diabetes mellitus with other specified complication: Secondary | ICD-10-CM | POA: Diagnosis not present

## 2021-04-25 DIAGNOSIS — E782 Mixed hyperlipidemia: Secondary | ICD-10-CM | POA: Diagnosis not present

## 2021-04-25 DIAGNOSIS — R54 Age-related physical debility: Secondary | ICD-10-CM | POA: Diagnosis not present

## 2021-04-25 DIAGNOSIS — E785 Hyperlipidemia, unspecified: Secondary | ICD-10-CM | POA: Diagnosis not present

## 2021-04-25 DIAGNOSIS — M25473 Effusion, unspecified ankle: Secondary | ICD-10-CM | POA: Diagnosis not present

## 2021-04-25 DIAGNOSIS — Z79899 Other long term (current) drug therapy: Secondary | ICD-10-CM | POA: Diagnosis not present

## 2021-04-25 DIAGNOSIS — R413 Other amnesia: Secondary | ICD-10-CM | POA: Diagnosis not present

## 2021-04-25 DIAGNOSIS — Z1211 Encounter for screening for malignant neoplasm of colon: Secondary | ICD-10-CM | POA: Diagnosis not present

## 2021-04-25 DIAGNOSIS — Z6833 Body mass index (BMI) 33.0-33.9, adult: Secondary | ICD-10-CM | POA: Diagnosis not present

## 2021-04-25 DIAGNOSIS — Z1231 Encounter for screening mammogram for malignant neoplasm of breast: Secondary | ICD-10-CM | POA: Diagnosis not present

## 2021-08-21 DIAGNOSIS — F3342 Major depressive disorder, recurrent, in full remission: Secondary | ICD-10-CM | POA: Diagnosis not present

## 2021-09-28 DIAGNOSIS — E785 Hyperlipidemia, unspecified: Secondary | ICD-10-CM | POA: Diagnosis not present

## 2021-09-28 DIAGNOSIS — K5792 Diverticulitis of intestine, part unspecified, without perforation or abscess without bleeding: Secondary | ICD-10-CM | POA: Diagnosis not present

## 2021-09-28 DIAGNOSIS — E1169 Type 2 diabetes mellitus with other specified complication: Secondary | ICD-10-CM | POA: Diagnosis not present

## 2021-09-28 DIAGNOSIS — Z6833 Body mass index (BMI) 33.0-33.9, adult: Secondary | ICD-10-CM | POA: Diagnosis not present

## 2021-10-26 DIAGNOSIS — Z6832 Body mass index (BMI) 32.0-32.9, adult: Secondary | ICD-10-CM | POA: Diagnosis not present

## 2021-10-26 DIAGNOSIS — R413 Other amnesia: Secondary | ICD-10-CM | POA: Diagnosis not present

## 2021-10-26 DIAGNOSIS — Z23 Encounter for immunization: Secondary | ICD-10-CM | POA: Diagnosis not present

## 2021-10-26 DIAGNOSIS — G629 Polyneuropathy, unspecified: Secondary | ICD-10-CM | POA: Diagnosis not present

## 2021-10-26 DIAGNOSIS — Z1231 Encounter for screening mammogram for malignant neoplasm of breast: Secondary | ICD-10-CM | POA: Diagnosis not present

## 2021-10-26 DIAGNOSIS — E782 Mixed hyperlipidemia: Secondary | ICD-10-CM | POA: Diagnosis not present

## 2021-10-26 DIAGNOSIS — E785 Hyperlipidemia, unspecified: Secondary | ICD-10-CM | POA: Diagnosis not present

## 2021-10-26 DIAGNOSIS — E669 Obesity, unspecified: Secondary | ICD-10-CM | POA: Diagnosis not present

## 2021-10-26 DIAGNOSIS — I10 Essential (primary) hypertension: Secondary | ICD-10-CM | POA: Diagnosis not present

## 2021-10-26 DIAGNOSIS — Z79899 Other long term (current) drug therapy: Secondary | ICD-10-CM | POA: Diagnosis not present

## 2021-10-26 DIAGNOSIS — E1169 Type 2 diabetes mellitus with other specified complication: Secondary | ICD-10-CM | POA: Diagnosis not present

## 2022-01-28 DIAGNOSIS — F419 Anxiety disorder, unspecified: Secondary | ICD-10-CM | POA: Diagnosis not present

## 2022-01-28 DIAGNOSIS — E782 Mixed hyperlipidemia: Secondary | ICD-10-CM | POA: Diagnosis not present

## 2022-01-28 DIAGNOSIS — J3089 Other allergic rhinitis: Secondary | ICD-10-CM | POA: Diagnosis not present

## 2022-01-28 DIAGNOSIS — E785 Hyperlipidemia, unspecified: Secondary | ICD-10-CM | POA: Diagnosis not present

## 2022-01-28 DIAGNOSIS — R413 Other amnesia: Secondary | ICD-10-CM | POA: Diagnosis not present

## 2022-01-28 DIAGNOSIS — K219 Gastro-esophageal reflux disease without esophagitis: Secondary | ICD-10-CM | POA: Diagnosis not present

## 2022-01-28 DIAGNOSIS — E1169 Type 2 diabetes mellitus with other specified complication: Secondary | ICD-10-CM | POA: Diagnosis not present

## 2022-01-28 DIAGNOSIS — Z79899 Other long term (current) drug therapy: Secondary | ICD-10-CM | POA: Diagnosis not present

## 2022-01-28 DIAGNOSIS — G629 Polyneuropathy, unspecified: Secondary | ICD-10-CM | POA: Diagnosis not present

## 2022-01-28 DIAGNOSIS — Z6832 Body mass index (BMI) 32.0-32.9, adult: Secondary | ICD-10-CM | POA: Diagnosis not present

## 2022-03-12 ENCOUNTER — Emergency Department (HOSPITAL_COMMUNITY): Payer: PPO

## 2022-03-12 ENCOUNTER — Inpatient Hospital Stay (HOSPITAL_COMMUNITY)
Admission: EM | Admit: 2022-03-12 | Discharge: 2022-03-15 | DRG: 065 | Disposition: A | Discharge status: Skilled Nursing Facility | Payer: PPO | Attending: Internal Medicine | Admitting: Internal Medicine

## 2022-03-12 ENCOUNTER — Encounter (HOSPITAL_COMMUNITY): Payer: Self-pay

## 2022-03-12 ENCOUNTER — Other Ambulatory Visit: Payer: Self-pay

## 2022-03-12 ENCOUNTER — Inpatient Hospital Stay (HOSPITAL_COMMUNITY): Payer: PPO

## 2022-03-12 DIAGNOSIS — E119 Type 2 diabetes mellitus without complications: Secondary | ICD-10-CM

## 2022-03-12 DIAGNOSIS — F0390 Unspecified dementia without behavioral disturbance: Secondary | ICD-10-CM | POA: Diagnosis not present

## 2022-03-12 DIAGNOSIS — Z66 Do not resuscitate: Secondary | ICD-10-CM | POA: Diagnosis present

## 2022-03-12 DIAGNOSIS — R739 Hyperglycemia, unspecified: Secondary | ICD-10-CM | POA: Diagnosis not present

## 2022-03-12 DIAGNOSIS — H510 Palsy (spasm) of conjugate gaze: Secondary | ICD-10-CM | POA: Diagnosis not present

## 2022-03-12 DIAGNOSIS — Z87891 Personal history of nicotine dependence: Secondary | ICD-10-CM

## 2022-03-12 DIAGNOSIS — E785 Hyperlipidemia, unspecified: Secondary | ICD-10-CM | POA: Diagnosis not present

## 2022-03-12 DIAGNOSIS — S299XXA Unspecified injury of thorax, initial encounter: Secondary | ICD-10-CM | POA: Diagnosis not present

## 2022-03-12 DIAGNOSIS — N390 Urinary tract infection, site not specified: Secondary | ICD-10-CM | POA: Diagnosis present

## 2022-03-12 DIAGNOSIS — E782 Mixed hyperlipidemia: Secondary | ICD-10-CM | POA: Diagnosis not present

## 2022-03-12 DIAGNOSIS — N3000 Acute cystitis without hematuria: Secondary | ICD-10-CM

## 2022-03-12 DIAGNOSIS — F339 Major depressive disorder, recurrent, unspecified: Secondary | ICD-10-CM | POA: Diagnosis not present

## 2022-03-12 DIAGNOSIS — R4182 Altered mental status, unspecified: Secondary | ICD-10-CM | POA: Diagnosis not present

## 2022-03-12 DIAGNOSIS — R410 Disorientation, unspecified: Secondary | ICD-10-CM | POA: Diagnosis not present

## 2022-03-12 DIAGNOSIS — R2689 Other abnormalities of gait and mobility: Secondary | ICD-10-CM | POA: Diagnosis not present

## 2022-03-12 DIAGNOSIS — R279 Unspecified lack of coordination: Secondary | ICD-10-CM | POA: Diagnosis not present

## 2022-03-12 DIAGNOSIS — I639 Cerebral infarction, unspecified: Principal | ICD-10-CM

## 2022-03-12 DIAGNOSIS — I63432 Cerebral infarction due to embolism of left posterior cerebral artery: Secondary | ICD-10-CM | POA: Diagnosis not present

## 2022-03-12 DIAGNOSIS — I672 Cerebral atherosclerosis: Secondary | ICD-10-CM | POA: Diagnosis not present

## 2022-03-12 DIAGNOSIS — Z6828 Body mass index (BMI) 28.0-28.9, adult: Secondary | ICD-10-CM

## 2022-03-12 DIAGNOSIS — H499 Unspecified paralytic strabismus: Secondary | ICD-10-CM | POA: Diagnosis present

## 2022-03-12 DIAGNOSIS — E876 Hypokalemia: Secondary | ICD-10-CM | POA: Diagnosis not present

## 2022-03-12 DIAGNOSIS — Z7401 Bed confinement status: Secondary | ICD-10-CM | POA: Diagnosis not present

## 2022-03-12 DIAGNOSIS — I6389 Other cerebral infarction: Secondary | ICD-10-CM | POA: Diagnosis not present

## 2022-03-12 DIAGNOSIS — R471 Dysarthria and anarthria: Secondary | ICD-10-CM | POA: Diagnosis not present

## 2022-03-12 DIAGNOSIS — I6340 Cerebral infarction due to embolism of unspecified cerebral artery: Secondary | ICD-10-CM | POA: Diagnosis not present

## 2022-03-12 DIAGNOSIS — E669 Obesity, unspecified: Secondary | ICD-10-CM | POA: Diagnosis not present

## 2022-03-12 DIAGNOSIS — E1165 Type 2 diabetes mellitus with hyperglycemia: Secondary | ICD-10-CM | POA: Diagnosis present

## 2022-03-12 DIAGNOSIS — F03A3 Unspecified dementia, mild, with mood disturbance: Secondary | ICD-10-CM | POA: Diagnosis not present

## 2022-03-12 DIAGNOSIS — K219 Gastro-esophageal reflux disease without esophagitis: Secondary | ICD-10-CM | POA: Diagnosis not present

## 2022-03-12 DIAGNOSIS — I959 Hypotension, unspecified: Secondary | ICD-10-CM | POA: Diagnosis not present

## 2022-03-12 DIAGNOSIS — I10 Essential (primary) hypertension: Secondary | ICD-10-CM

## 2022-03-12 DIAGNOSIS — R29702 NIHSS score 2: Secondary | ICD-10-CM | POA: Diagnosis present

## 2022-03-12 DIAGNOSIS — J449 Chronic obstructive pulmonary disease, unspecified: Secondary | ICD-10-CM | POA: Diagnosis not present

## 2022-03-12 DIAGNOSIS — E1340 Other specified diabetes mellitus with diabetic neuropathy, unspecified: Secondary | ICD-10-CM | POA: Diagnosis not present

## 2022-03-12 DIAGNOSIS — M6281 Muscle weakness (generalized): Secondary | ICD-10-CM | POA: Diagnosis not present

## 2022-03-12 DIAGNOSIS — G4700 Insomnia, unspecified: Secondary | ICD-10-CM | POA: Diagnosis not present

## 2022-03-12 DIAGNOSIS — F32A Depression, unspecified: Secondary | ICD-10-CM

## 2022-03-12 DIAGNOSIS — R41841 Cognitive communication deficit: Secondary | ICD-10-CM | POA: Diagnosis not present

## 2022-03-12 DIAGNOSIS — I63412 Cerebral infarction due to embolism of left middle cerebral artery: Secondary | ICD-10-CM | POA: Diagnosis not present

## 2022-03-12 DIAGNOSIS — R29818 Other symptoms and signs involving the nervous system: Secondary | ICD-10-CM | POA: Diagnosis not present

## 2022-03-12 DIAGNOSIS — W19XXXA Unspecified fall, initial encounter: Secondary | ICD-10-CM | POA: Diagnosis not present

## 2022-03-12 LAB — CBC WITH DIFFERENTIAL/PLATELET
Abs Immature Granulocytes: 0.04 10*3/uL (ref 0.00–0.07)
Basophils Absolute: 0.1 10*3/uL (ref 0.0–0.1)
Basophils Relative: 1 %
Eosinophils Absolute: 0.1 10*3/uL (ref 0.0–0.5)
Eosinophils Relative: 1 %
HCT: 40.7 % (ref 36.0–46.0)
Hemoglobin: 13.7 g/dL (ref 12.0–15.0)
Immature Granulocytes: 1 %
Lymphocytes Relative: 19 %
Lymphs Abs: 1.5 10*3/uL (ref 0.7–4.0)
MCH: 30.2 pg (ref 26.0–34.0)
MCHC: 33.7 g/dL (ref 30.0–36.0)
MCV: 89.6 fL (ref 80.0–100.0)
Monocytes Absolute: 0.7 10*3/uL (ref 0.1–1.0)
Monocytes Relative: 8 %
Neutro Abs: 5.8 10*3/uL (ref 1.7–7.7)
Neutrophils Relative %: 70 %
Platelets: 215 10*3/uL (ref 150–400)
RBC: 4.54 MIL/uL (ref 3.87–5.11)
RDW: 12.9 % (ref 11.5–15.5)
WBC: 8.3 10*3/uL (ref 4.0–10.5)
nRBC: 0 % (ref 0.0–0.2)

## 2022-03-12 LAB — COMPREHENSIVE METABOLIC PANEL
ALT: 19 U/L (ref 0–44)
AST: 16 U/L (ref 15–41)
Albumin: 3.3 g/dL — ABNORMAL LOW (ref 3.5–5.0)
Alkaline Phosphatase: 156 U/L — ABNORMAL HIGH (ref 38–126)
Anion gap: 8 (ref 5–15)
BUN: 9 mg/dL (ref 8–23)
CO2: 25 mmol/L (ref 22–32)
Calcium: 8.3 mg/dL — ABNORMAL LOW (ref 8.9–10.3)
Chloride: 107 mmol/L (ref 98–111)
Creatinine, Ser: 1.19 mg/dL — ABNORMAL HIGH (ref 0.44–1.00)
GFR, Estimated: 49 mL/min — ABNORMAL LOW (ref >60)
Glucose, Bld: 240 mg/dL — ABNORMAL HIGH (ref 70–99)
Potassium: 2.6 mmol/L — CL (ref 3.5–5.1)
Sodium: 140 mmol/L (ref 135–145)
Total Bilirubin: 1.2 mg/dL (ref 0.3–1.2)
Total Protein: 5.6 g/dL — ABNORMAL LOW (ref 6.5–8.1)

## 2022-03-12 LAB — CK: Total CK: 110 U/L (ref 38–234)

## 2022-03-12 LAB — PROTIME-INR
INR: 1.1 (ref 0.8–1.2)
Prothrombin Time: 14.1 seconds (ref 11.4–15.2)

## 2022-03-12 MED ORDER — MAGNESIUM OXIDE -MG SUPPLEMENT 400 (240 MG) MG PO TABS: 800.0000 mg | ORAL_TABLET | Freq: Once | ORAL | Status: DC

## 2022-03-12 MED ORDER — INSULIN ASPART 100 UNIT/ML IJ SOLN
0.0000 [IU] | INTRAMUSCULAR | Status: DC
  Administered 2022-03-13: 1 [IU] via SUBCUTANEOUS
  Administered 2022-03-13: 2 [IU] via SUBCUTANEOUS
  Administered 2022-03-13 (×3): 3 [IU] via SUBCUTANEOUS
  Administered 2022-03-14: 5 [IU] via SUBCUTANEOUS
  Administered 2022-03-14: 1 [IU] via SUBCUTANEOUS
  Administered 2022-03-14 (×2): 2 [IU] via SUBCUTANEOUS
  Administered 2022-03-14: 1 [IU] via SUBCUTANEOUS
  Administered 2022-03-14: 3 [IU] via SUBCUTANEOUS
  Administered 2022-03-15: 1 [IU] via SUBCUTANEOUS

## 2022-03-12 MED ORDER — POTASSIUM CHLORIDE 10 MEQ/100ML IV SOLN
10.0000 meq | INTRAVENOUS | Status: AC
  Administered 2022-03-12 – 2022-03-13 (×4): 10 meq via INTRAVENOUS
  Filled 2022-03-12 (×4): qty 100

## 2022-03-12 MED ORDER — MAGNESIUM SULFATE IN D5W 1-5 GM/100ML-% IV SOLN
1.0000 g | Freq: Once | INTRAVENOUS | Status: AC
  Administered 2022-03-12: 1 g via INTRAVENOUS
  Filled 2022-03-12: qty 100

## 2022-03-12 MED ORDER — POTASSIUM CHLORIDE CRYS ER 20 MEQ PO TBCR: 40.0000 meq | EXTENDED_RELEASE_TABLET | Freq: Once | ORAL | Status: DC

## 2022-03-12 NOTE — Assessment & Plan Note (Signed)
Allow permissive hypertension for today ?

## 2022-03-12 NOTE — ED Triage Notes (Signed)
Pt arrived from home via [facility]. Pt was found on floor by friend unknown down time. Unable to tell me her age correctly. States that she knows she can't get the right words to come out. Last known well 03/10/2022 1800 ?

## 2022-03-12 NOTE — ED Notes (Signed)
Date and time results received: 03/12/22 1926 ?(use smartphrase ".now" to insert current time) ? ?Test: potassium  ?Critical Value: 2.6 ? ?Name of Provider Notified: Doreene Nest, MD ? ?

## 2022-03-12 NOTE — Assessment & Plan Note (Signed)
Order sliding scale hold p.o. medications for tonight 

## 2022-03-12 NOTE — ED Notes (Signed)
Called for triage, no answer. Sort NT unable to indentify which patient this was. ?

## 2022-03-12 NOTE — Assessment & Plan Note (Signed)
-   will admit based on TIA/CVA protocol  ?      Monitor on Tele ?       /MRI   Resulted - showing acute ischemic CVA Small acute infarct of the left thalamus extending into the left ?paramedian midbrain ?      CTA ordered ?      Echo to evaluate for possible embolic source,  ?      obtain cardiac enzymes,  ECG,   Lipid panel, TSH.  ?      Order PT/OT evaluation.  ?      will need Speech pathology evaluation ?      Will make sure patient is on antiplatelet ASA   325  and statin  ?      Allow permissive Hypertension keep BP <220/120  ?      Neurology consulted Have seen pt in ER  ? ?

## 2022-03-12 NOTE — Subjective & Objective (Signed)
Arriving from [facility] found on the floor by her friend unknown downtime seems to come and had hard time producing words. ?

## 2022-03-12 NOTE — Assessment & Plan Note (Signed)
Restart statin check lipid panel ?

## 2022-03-12 NOTE — Consult Note (Signed)
Neurology Consultation ? ?Reason for Consult: stroke ?Referring Physician: Dr Matilde Sprang EDP ? ?CC: Found down, altered mental status ? ?History is obtained from: Patient, chart, patient's friend at bedside ? ?HPI: Rachael Lane is a 71 y.o. female past medical history of diabetes, hypertension, hyperlipidemia, lives independently, was last seen normal sometime Sunday-March 10, 2022 in the evening and then found by a friend down on the ground today and brought to the hospital for further evaluation.  Patient had speech difficulty-mostly word finding and some slurred speech on arrival.  Last known well was outside the window for intervention.  Patient was able to tell the ED provider that she had a fall from the couch 2 days ago but could not tell further much after that. ?Friend also reports that her medications come from the pharmacy in the blister pack and were unopened since at least Thursday of last week. ?Unclear compliance to medications and medical treatment at this time ? ? ?LKW: 03/10/2022 evening ?tpa given?: no, OSW ?Premorbid modified Rankin scale (mRS): 1 ? ? ?[id number] to obtain due to altered mental status.  ? ?Past Medical History:  ?Diagnosis Date  ? Arthritis   ? Cataract   ? Depression   ? Diabetes mellitus without complication ([place])   ? Hyperlipidemia   ? Hypertension   ? ? ?History reviewed. No pertinent family history. ? ? ?Social History:  ? reports that she has never smoked. She has never used smokeless tobacco. She reports that she does not drink alcohol and does not use drugs. ? ?Medications ? ?Current Facility-Administered Medications:  ?  magnesium sulfate IVPB 1 g 100 mL, 1 g, Intravenous, Once, Kommor, Madison, MD, Last Rate: 100 mL/hr at 03/12/22 2337, 1 g at 03/12/22 2337 ?  potassium chloride 10 mEq in 100 mL IVPB, 10 mEq, Intravenous, Q1 Hr x 4, Kommor, Madison, MD, Last Rate: 100 mL/hr at 03/12/22 2333, 10 mEq at 03/12/22 2333 ? ?Current Outpatient Medications:  ?  ACCU-CHEK AVIVA PLUS  test strip, , Disp: , Rfl:  ?  AFLURIA PRESERVATIVE FREE injection, , Disp: , Rfl:  ?  amoxicillin-clavulanate (AUGMENTIN) 875-125 MG tablet, Take 1 tablet by mouth 2 (two) times daily., Disp: 28 tablet, Rfl: 0 ?  ciprofloxacin (CIPRO) 500 MG tablet, , Disp: , Rfl:  ?  clotrimazole (CLOTRIMAZOLE ANTI-FUNGAL) 1 % cream, Apply topically 2 (two) times daily., Disp: 30 g, Rfl: 3 ?  FLUoxetine (PROZAC) 20 MG capsule, , Disp: , Rfl:  ?  gabapentin (NEURONTIN) 100 MG capsule, , Disp: , Rfl:  ?  JARDIANCE 25 MG TABS tablet, , Disp: , Rfl:  ?  lisinopril (PRINIVIL,ZESTRIL) 5 MG tablet, , Disp: , Rfl:  ?  meloxicam (MOBIC) 7.5 MG tablet, , Disp: , Rfl:  ?  omeprazole (PRILOSEC) 40 MG capsule, , Disp: , Rfl:  ?  oxyCODONE-acetaminophen (PERCOCET) 7.5-325 MG per tablet, , Disp: , Rfl:  ?  propranolol ER (INDERAL LA) 80 MG 24 hr capsule, , Disp: , Rfl:  ?  simvastatin (ZOCOR) 40 MG tablet, , Disp: , Rfl:  ?  temazepam (RESTORIL) 15 MG capsule, , Disp: , Rfl:  ?  terbinafine (LAMISIL) 250 MG tablet, , Disp: , Rfl:  ?  traMADol (ULTRAM) 50 MG tablet, , Disp: , Rfl:  ?  traZODone (DESYREL) 150 MG tablet, , Disp: , Rfl:  ? ?Exam: ?Current vital signs: ?BP (!) 153/94   Pulse 78   Resp 20   Ht 5\' 9"  (1.753 m)   Wt  88.2 kg   SpO2 95%   BMI 28.72 kg/m?  ?Vital signs in last 24 hours: ?Pulse Rate:  [70-83] 78 (04/04 2200) ?Resp:  [15-20] 20 (04/04 2200) ?BP: (139-153)/(80-94) 153/94 (04/04 2200) ?SpO2:  [95 %-100 %] 95 % (04/04 2200) ?Weight:  [88.2 kg] 88.2 kg (04/04 1936) ? ?GENERAL: Awake, alert in NAD ?HEENT: - Normocephalic and atraumatic, dry mm, no LN++, no Thyromegally ?LUNGS - Clear to auscultation bilaterally with no wheezes ?CV - S1S2 RRR, no m/r/g, equal pulses bilaterally. ?ABDOMEN - Soft, nontender, nondistended with normoactive BS ?Ext: warm, well perfused, intact peripheral pulses, NO edema ? ?NEURO:  ?Mental Status: Awake, alert, oriented to self, got the age wrong, could not tell me the month ?Language: speech is  dysarthric.  Naming, repetition, fluency, and comprehension intact. ?Cranial Nerves: PERRL EOM-impaired-unable to abduct the right eye all the way and has nystagmus in the right eye while looking to the right, visual fields full, no facial asymmetry, facial sensation intact, hearing intact, tongue/uvula/soft palate midline, normal sternocleidomastoid and trapezius muscle strength. No evidence of tongue atrophy or fibrillations ?Motor: Antigravity nearly symmetric strength in all 4 extremities without vertical drift ?Tone: is normal and bulk is normal ?Sensation- Intact to light touch bilaterally, no extinction ?Coordination: Mild dysmetria in all 4 ?Gait- deferred ? ?NIHSS ?1a Level of Conscious.: 0 ?1b LOC Questions: 2 ?1c LOC Commands: 0 ?2 Best Gaze: 1 ?3 Visual: 0 ?4 Facial Palsy: 0 ?5a Motor Arm - left: 0 ?5b Motor Arm - Right: 0 ?6a Motor Leg - Left: 0 ?6b Motor Leg - Right: 0 ?7 Limb Ataxia: 2 ?8 Sensory: 0 ?9 Best Language: 1 ?10 Dysarthria: 1 ?11 Extinct. and Inatten.: 0 ?TOTAL: 7 ? ? ? ?Labs ?I have reviewed labs in epic and the results pertinent to this consultation are: ? ? ?CBC ?   ?Component Value Date/Time  ? WBC 8.3 03/12/2022 2021  ? RBC 4.54 03/12/2022 2021  ? HGB 13.7 03/12/2022 2021  ? HCT 40.7 03/12/2022 2021  ? PLT 215 03/12/2022 2021  ? MCV 89.6 03/12/2022 2021  ? MCH 30.2 03/12/2022 2021  ? MCHC 33.7 03/12/2022 2021  ? RDW 12.9 03/12/2022 2021  ? LYMPHSABS 1.5 03/12/2022 2021  ? MONOABS 0.7 03/12/2022 2021  ? EOSABS 0.1 03/12/2022 2021  ? BASOSABS 0.1 03/12/2022 2021  ? ? ?CMP  ?   ?Component Value Date/Time  ? NA 140 03/12/2022 2021  ? K 2.6 (LL) 03/12/2022 2021  ? CL 107 03/12/2022 2021  ? CO2 25 03/12/2022 2021  ? GLUCOSE 240 (H) 03/12/2022 2021  ? BUN 9 03/12/2022 2021  ? CREATININE 1.19 (H) 03/12/2022 2021  ? CALCIUM 8.3 (L) 03/12/2022 2021  ? PROT 5.6 (L) 03/12/2022 2021  ? ALBUMIN 3.3 (L) 03/12/2022 2021  ? AST 16 03/12/2022 2021  ? ALT 19 03/12/2022 2021  ? ALKPHOS 156 (H) 03/12/2022  2021  ? BILITOT 1.2 03/12/2022 2021  ? GFRNONAA 49 (L) 03/12/2022 2021  ? ? ? ?Imaging ?I have reviewed the images obtained: ? ?CT-head-concerning for left thalamic subacute infarct ? ?MRI examination of the brain acute-subacute left thalamic infarct extending into the midbrain on the left ? ?Assessment:  ?71 year old with PMH as above with 2 days history of word finding difficulty and difficulty ambulation, on exam NIHSS 7 has a left thalamic and left midbrain stroke - likely PCA branch infarct. ?Etiology under investigation. ? ?Recommendations: ?-Admit to hospitalist  ?-Telemetry monitoring ?-Allow for permissive hypertension for the first 24-48h -  only treat PRN if SBP >220 mmHg. Blood pressures can be gradually normalized to SBP<140 upon discharge. ?-CT Angiogram of Head and neck ?-Echocardiogram ?-HgbA1c, fasting lipid panel ?-Frequent neuro checks ?-Prophylactic therapy-Antiplatelet med: none at home. Start Aspirin - dose 325mg  PO or 300mg  PR. May need DAPT - will await vessel imaging before making that decision. ?-Atorvastatin 80 mg PO daily ?-Risk factor modification ?-PT consult, OT consult, Speech consult ?-If Afib found on telemetry, will need anticoagulation. Decision pending imaging and stroke team rounding. ? ?D/W Dr. Pearletha Forge in the ER. ? ?-- ?Amie Portland, MD ?Neurologist ?[facility] ?Pager: [phone] ?

## 2022-03-12 NOTE — ED Provider Notes (Signed)
?[facility] EMERGENCY DEPARTMENT ?Provider Note ? ?CSN: [id number] ?Arrival date & time: 03/12/22 1915 ? ?Chief Complaint(s) ?Altered Mental Status ? ?HPI ?Rachael Lane is a 71 y.o. female with PMH HLD, HTN, T2DM who presents emergency department for evaluation of altered mental status.  History limited but patient states that she fell off her couch approximately 2 days ago and has been unable to get off the ground since.  Patient with initial difficulty in word finding for EMS and last known well 03/10/2022 at 1800.  On arrival, patient states that she has suprapubic abdominal pain and that her dysarthria is getting better.  She denies chest pain, shortness of breath, abdominal pain, headache, fever or other systemic symptoms. ? ? ?Altered Mental Status ?Associated symptoms: abdominal pain   ? ?Past Medical History ?Past Medical History:  ?Diagnosis Date  ? Arthritis   ? Cataract   ? Depression   ? Diabetes mellitus without complication (H[facility])   ? Hyperlipidemia   ? Hypertension   ? ?There are no problems to display for this patient. ? ?Home Medication(s) ?Prior to Admission medications   ?Medication Sig Start Date End Date Taking? Authorizing Provider  ?ACCU-CHEK AVIVA PLUS test strip  08/09/13   [provider]  ?AFLURIA PRESERVATIVE FREE injection  09/03/13   [provider]  ?amoxicillin-clavulanate (AUGMENTIN) 875-125 MG tablet Take 1 tablet by mouth 2 (two) times daily. 12/13/16   Asencion Islam, DPM  ?ciprofloxacin (CIPRO) 500 MG tablet  09/10/13   [provider]  ?clotrimazole (CLOTRIMAZOLE ANTI-FUNGAL) 1 % cream Apply topically 2 (two) times daily. 10/14/13   Alvan Dame, DPM  ?FLUoxetine (PROZAC) 20 MG capsule  09/11/13   [provider]  ?gabapentin (NEURONTIN) 100 MG capsule  05/31/15   [provider]  ?JARDIANCE 25 MG TABS tablet  06/06/15   [provider]  ?lisinopril (PRINIVIL,ZESTRIL) 5 MG tablet  05/05/15   [provider]   ?meloxicam (MOBIC) 7.5 MG tablet  05/31/15   [provider]  ?omeprazole (PRILOSEC) 40 MG capsule  09/10/13   [provider]  ?oxyCODONE-acetaminophen (PERCOCET) 7.5-325 MG per tablet  07/06/13   [provider]  ?propranolol ER (INDERAL LA) 80 MG 24 hr capsule  09/11/13   [provider]  ?simvastatin (ZOCOR) 40 MG tablet  08/28/13   [provider]  ?temazepam (RESTORIL) 15 MG capsule  05/19/15   [provider]  ?terbinafine (LAMISIL) 250 MG tablet  05/31/15   [provider]  ?traMADol Janean Sark) 50 MG tablet  05/05/15   [provider]  ?traZODone (DESYREL) 150 MG tablet  09/29/13   [provider]  ?                                                                                                                                  ?Past Surgical History ?History reviewed. No pertinent surgical history. ?Family History ?History reviewed.  No pertinent family history. ? ?Social History ?Social History  ? ?Tobacco Use  ? Smoking status: Never  ? Smokeless tobacco: Never  ?Substance Use Topics  ? Alcohol use: No  ? Drug use: No  ? ?Allergies ?Patient has no known allergies. ? ?Review of Systems ?Review of Systems  ?Gastrointestinal:  Positive for abdominal pain.  ?Neurological:  Positive for speech difficulty.  ? ?Physical Exam ?Vital Signs  ?I have reviewed the triage vital signs ?BP (!) 153/94   Pulse 78   Resp 20   Ht 5\' 9"  (1.753 m)   Wt 88.2 kg   SpO2 95%   BMI 28.72 kg/m?  ? ?Physical Exam ?Vitals and nursing note reviewed.  ?Constitutional:   ?   General: She is not in acute distress. ?   Appearance: She is well-developed.  ?HENT:  ?   Head: Normocephalic and atraumatic.  ?Eyes:  ?   Conjunctiva/sclera: Conjunctivae normal.  ?Cardiovascular:  ?   Rate and Rhythm: Normal rate and regular rhythm.  ?   Heart sounds: No murmur heard. ?Pulmonary:  ?   Effort: Pulmonary effort is normal. No respiratory distress.  ?   Breath sounds: Normal  breath sounds.  ?Abdominal:  ?   Palpations: Abdomen is soft.  ?   Tenderness: There is abdominal tenderness (Suprapubic).  ?Musculoskeletal:     ?   General: No swelling.  ?   Cervical back: Neck supple.  ?Skin: ?   General: Skin is warm and dry.  ?   Capillary Refill: Capillary refill takes less than 2 seconds.  ?Neurological:  ?   Mental Status: She is alert. She is disoriented.  ?   Cranial Nerves: Cranial nerve deficit (Ophthalmoplegia) present.  ?   Sensory: No sensory deficit.  ?   Motor: No weakness.  ?Psychiatric:     ?   Mood and Affect: Mood normal.  ? ? ?ED Results and Treatments ?Labs ?(all labs ordered are listed, but only abnormal results are displayed) ?Labs Reviewed  ?COMPREHENSIVE METABOLIC PANEL - Abnormal; Notable for the following components:  ?    Result Value  ? Potassium 2.6 (*)   ? Glucose, Bld 240 (*)   ? Creatinine, Ser 1.19 (*)   ? Calcium 8.3 (*)   ? Total Protein 5.6 (*)   ? Albumin 3.3 (*)   ? Alkaline Phosphatase 156 (*)   ? GFR, Estimated 49 (*)   ? All other components within normal limits  ?CBC WITH DIFFERENTIAL/PLATELET  ?CK  ?PROTIME-INR  ?URINALYSIS, ROUTINE W REFLEX MICROSCOPIC  ?                                                                                                                       ? ?Radiology ?CT HEAD WO CONTRAST (5MM) ? ?Result Date: 03/12/2022 ?CLINICAL DATA:  Acute neurological deficit. Stroke suspected. Patient was found on the floor. EXAM: CT HEAD WITHOUT CONTRAST TECHNIQUE: Contiguous axial images were obtained from the base of the  skull through the vertex without intravenous contrast. RADIATION DOSE REDUCTION: This exam was performed according to the departmental dose-optimization program which includes automated exposure control, adjustment of the mA and/or kV according to patient size and/or use of iterative reconstruction technique. COMPARISON:  07/11/2012 FINDINGS: Brain: Diffuse cerebral atrophy. Ventricular dilatation consistent with central atrophy.  Low-attenuation changes in the deep white matter consistent with small vessel ischemia. Asymmetric low-attenuation in the left thalamus suggesting acute or subacute infarct. Consider MRI for further characterization. Prominent CSF space in the posterior fossa likely representing prominent cisterna magna. No abnormal extra-axial fluid collections. No mass effect or midline shift. Gray-white matter junctions are distinct. Basal cisterns are not effaced. No acute intracranial hemorrhage. Vascular: Moderate intracranial arterial vascular calcifications. Skull: Calvarium appears intact. Sinuses/Orbits: Retention cyst or polyp in the right anterior nasal passage. Paranasal sinuses and mastoid air cells are otherwise clear. Other: None. IMPRESSION: 1. Asymmetrical low-attenuation in the left thalamus suggesting acute or subacute infarct. Consider MRI for further evaluation. 2. Chronic atrophy and small vessel ischemic changes diffusely. 3. No acute intracranial hemorrhage. Electronically Signed   By: Burman Nieves M.D.   On: 03/12/2022 20:23  ? ?MR BRAIN WO CONTRAST ? ?Result Date: 03/12/2022 ?CLINICAL DATA:  Acute neurologic deficit EXAM: MRI HEAD WITHOUT CONTRAST TECHNIQUE: Multiplanar, multiecho pulse sequences of the brain and surrounding structures were obtained without intravenous contrast. COMPARISON:  None. FINDINGS: Brain: Small acute infarct of the left thalamus extending into the left paramedian midbrain. No acute or chronic hemorrhage. Hyperintense T2-weighted signal is moderately widespread throughout the white matter. Generalized volume loss without a clear lobar predilection. A partially empty sella is incidentally noted. Vascular: Major flow voids are preserved. Skull and upper cervical spine: Normal calvarium and skull base. Visualized upper cervical spine and soft tissues are normal. Sinuses/Orbits:No paranasal sinus fluid levels or advanced mucosal thickening. No mastoid or middle ear effusion. Normal  orbits. IMPRESSION: 1. Small acute infarct of the left thalamus extending into the left paramedian midbrain. No hemorrhage or mass effect. 2. Moderate chronic small vessel ischemic disease and generalized vo

## 2022-03-12 NOTE — Assessment & Plan Note (Signed)
If replace potassium check magnesium level ?

## 2022-03-12 NOTE — H&P (Signed)
? ? Rachael Lane LGX:[id number] DOB: 1951/07/25 DOA: 03/12/2022 ?   ?PCP: Simone Curia, MD   ?Outpatient Specialists: NONE ?   ?Patient arrived to ER on 03/12/22 at 1915 ?Referred by Attending Kommor, Wyn Forster, MD ? ? ?Patient coming from:   ? home Lives alone,     ?  ? ?Chief Complaint:   ?Chief Complaint  ?Patient presents with  ? Altered Mental Status  ? ? ?HPI: ?Rachael Lane is a 71 y.o. female with medical history significant of  Obesity diabetes type 2   depression, hld, htn, mild dementia ?  ? ?Presented with   confusion ?Arriving from [facility] found on the floor by her friend unknown downtime seems to come and had hard time producing words. ?Patient apparently has been down for at least 2 days having been taking her medicines for a few days now.  Also reports some suprapubic abdominal pain. ? No longer smokes no EtOH ?Denies any CP  ?Shefell down few days ago ?And did not get up she tried to pull herself to the bathroom ?Did not drink anything ?Could not get to the phone ? ?Regarding pertinent Chronic problems:   ? ? Hyperlipidemia -  on statins Lipitor ?Lipid Panel  ?No results found for: CHOL, TRIG, HDL, CHOLHDL, VLDL, LDLCALC, LDLDIRECT, LABVLDL ? ? HTN on catapress, propranolol ?  ?  ?  DM 2 -  on  Jiardiance ?   ?  COPD - not  followed by pulmonology   not  on baseline oxygen   ?  ?Dementia - Aricept ? ?While in ER: ?  ?Noted to have evidence of stroke neurology has been consulted will see patient in consult recommended CTA head ?  ? ?Ordered ? ?CT HEAD Asymmetrical low-attenuation in the left thalamus suggesting ?acute or subacute infarct.  ? ?Mri Small acute infarct of the left thalamus extending into the left ?paramedian midbrain. No hemorrhage or mass effect. ?2. Moderate chronic small vessel ischemic disease and generalized ?volume loss. ? ?CXR -  NON acute ?  ? ?Following Medications were ordered in ER: ?Medications  ?magnesium sulfate IVPB 1 g 100 mL (1 g Intravenous New Bag/Given 03/12/22 2337)   ?potassium chloride 10 mEq in 100 mL IVPB (10 mEq Intravenous New Bag/Given 03/12/22 2333)  ?  ?_______________________________________________________ ?ER Provider Called:  nEUROLOGY   Dr. Wilford Corner ?They Recommend admit to medicine   ? SEEN in ER ?  ?ED Triage Vitals  ?Enc Vitals Group  ?   BP 03/12/22 1945 (!) 146/90  ?   Pulse Rate 03/12/22 1945 83  ?   Resp 03/12/22 1945 15  ?   Temp --   ?   Temp src --   ?   SpO2 03/12/22 1945 100 %  ?   Weight 03/12/22 1936 194 lb 8 oz (88.2 kg)  ?   Height 03/12/22 1936 5\' 9"  (1.753 m)  ?   Head Circumference --   ?   Peak Flow --   ?   Pain Score 03/12/22 1935 0  ?   Pain Loc --   ?   Pain Edu? --   ?   Excl. in GC? --   ?05/12/22    ? _________________________________________ ?Significant initial  Findings: ?Abnormal Labs Reviewed  ?COMPREHENSIVE METABOLIC PANEL - Abnormal; Notable for the following components:  ?    Result Value  ? Potassium 2.6 (*)   ? Glucose, Bld 240 (*)   ? Creatinine, Ser 1.19 (*)   ?  Calcium 8.3 (*)   ? Total Protein 5.6 (*)   ? Albumin 3.3 (*)   ? Alkaline Phosphatase 156 (*)   ? GFR, Estimated 49 (*)   ? All other components within normal limits  ? ?  ?_________________________ ?Troponin  ordered ?ECG: Ordered ?Personally reviewed by me showing: ?HR : 76 ?Rhythm: Sinus rhythm ?Left axis deviation ?QTC 436 ? ?  ?The recent clinical data is shown below. ?Vitals:  ? 03/12/22 1945 03/12/22 2133 03/12/22 2145 03/12/22 2200  ?BP: (!) 146/90 139/80 (!) 153/90 (!) 153/94  ?Pulse: 83 79 70 78  ?Resp: 15 17 15 20   ?SpO2: 100% 97% 98% 95%  ?Weight:      ?Height:      ?  ?WBC ? ?   ?Component Value Date/Time  ? WBC 8.3 03/12/2022 2021  ? LYMPHSABS 1.5 03/12/2022 2021  ? MONOABS 0.7 03/12/2022 2021  ? EOSABS 0.1 03/12/2022 2021  ? BASOSABS 0.1 03/12/2022 2021  ?  ? ? UA  evidence of UTI    ?  ?Urine analysis: ?   ?Component Value Date/Time  ? COLORURINE YELLOW 03/12/2022 2347  ? APPEARANCEUR CLEAR 03/12/2022 2347  ? LABSPEC 1.028 03/12/2022 2347  ? PHURINE 5.0  03/12/2022 2347  ? GLUCOSEU >=500 (A) 03/12/2022 2347  ? HGBUR NEGATIVE 03/12/2022 2347  ? BILIRUBINUR NEGATIVE 03/12/2022 2347  ? KETONESUR 20 (A) 03/12/2022 2347  ? PROTEINUR NEGATIVE 03/12/2022 2347  ? NITRITE NEGATIVE 03/12/2022 2347  ? LEUKOCYTESUR SMALL (A) 03/12/2022 2347  ? ? ?No results found for this or any previous visit. ?  ?_______________________________________________ ?Hospitalist was called for admission for  CVA ? ? ?The following Work up has been ordered so far: ? ?Orders Placed This Encounter  ?Procedures  ? CT HEAD WO CONTRAST (5MM)  ? MR BRAIN WO CONTRAST  ? CT ANGIO HEAD NECK W WO CM  ? Comprehensive metabolic panel  ? CBC with Differential  ? Urinalysis, Routine w reflex microscopic  ? CK  ? Protime-INR  ? Consult to neurology  ? Consult to hospitalist  ? EKG 12-Lead  ?  ? ?OTHER Significant initial  Findings: ? ?labs showing:  ?  ?Recent Labs  ?Lab 03/12/22 ?2021  ?NA 140  ?K 2.6*  ?CO2 25  ?GLUCOSE 240*  ?BUN 9  ?CREATININE 1.19*  ?CALCIUM 8.3*  ? ? ?Cr   ,  Up from baseline see below ?Lab Results  ?Component Value Date  ? CREATININE 1.19 (H) 03/12/2022  ? ? ?Recent Labs  ?Lab 03/12/22 ?2021  ?AST 16  ?ALT 19  ?ALKPHOS 156*  ?BILITOT 1.2  ?PROT 5.6*  ?ALBUMIN 3.3*  ? ?Lab Results  ?Component Value Date  ? CALCIUM 8.3 (L) 03/12/2022  ? ?    ?   ?Plt: ?Lab Results  ?Component Value Date  ? PLT 215 03/12/2022  ?   ?   ?Recent Labs  ?Lab 03/12/22 ?2021  ?WBC 8.3  ?NEUTROABS 5.8  ?HGB 13.7  ?HCT 40.7  ?MCV 89.6  ?PLT 215  ? ? ?HG/HCT  stable,    ?   ?Component Value Date/Time  ? HGB 13.7 03/12/2022 2021  ? HCT 40.7 03/12/2022 2021  ? MCV 89.6 03/12/2022 2021  ? ?  ?No results for input(s): LIPASE, AMYLASE in the last 168 hours. ?No results for input(s): AMMONIA in the last 168 hours. ?  ? ?Cardiac Panel (last 3 results) ?Recent Labs  ?  03/12/22 ?2021  ?CKTOTAL 110  ? ?   ?  BNP (last 3 results) ?No results for input(s): BNP in the last 8760 hours. ?  ? ?DM  labs:  ?HbA1C: ?No results for  input(s): HGBA1C in the last 8760 hours. ?  ?  ?CBG (last 3)  ?No results for input(s): GLUCAP in the last 72 hours. ?  ? ?    ?Cultures: ?No results found for: SDES, SPECREQUEST, CULT, REPTSTATUS ?  ?Radiological Exams on Admission: ?CT HEAD WO CONTRAST ( ) ? ?Result Date: 03/12/2022 ?CLINICAL DATA:  Acute neurological deficit. Stroke suspected. Patient was found on the floor. EXAM: CT HEAD WITHOUT CONTRAST TECHNIQUE: Contiguous axial images were obtained from the base of the skull through the vertex without intravenous contrast. RADIATION DOSE REDUCTION: This exam was performed according to the departmental dose-optimization program which includes automated exposure control, adjustment of the mA and/or kV according to patient size and/or use of iterative reconstruction technique. COMPARISON:  07/11/2012 FINDINGS: Brain: Diffuse cerebral atrophy. Ventricular dilatation consistent with central atrophy. Low-attenuation changes in the deep white matter consistent with small vessel ischemia. Asymmetric low-attenuation in the left thalamus suggesting acute or subacute infarct. Consider MRI for further characterization. Prominent CSF space in the posterior fossa likely representing prominent cisterna magna. No abnormal extra-axial fluid collections. No mass effect or midline shift. Gray-white matter junctions are distinct. Basal cisterns are not effaced. No acute intracranial hemorrhage. Vascular: Moderate intracranial arterial vascular calcifications. Skull: Calvarium appears intact. Sinuses/Orbits: Retention cyst or polyp in the right anterior nasal passage. Paranasal sinuses and mastoid air cells are otherwise clear. Other: None. IMPRESSION: 1. Asymmetrical low-attenuation in the left thalamus suggesting acute or subacute infarct. Consider MRI for further evaluation. 2. Chronic atrophy and small vessel ischemic changes diffusely. 3. No acute intracranial hemorrhage. Electronically Signed   By: Burman Nieves M.D.    On: 03/12/2022 20:23  ? ?MR BRAIN WO CONTRAST ? ?Result Date: 03/12/2022 ?CLINICAL DATA:  Acute neurologic deficit EXAM: MRI HEAD WITHOUT CONTRAST TECHNIQUE: Multiplanar, multiecho pulse sequences of the brain and

## 2022-03-13 ENCOUNTER — Inpatient Hospital Stay (HOSPITAL_COMMUNITY): Payer: PPO

## 2022-03-13 DIAGNOSIS — I6389 Other cerebral infarction: Secondary | ICD-10-CM

## 2022-03-13 DIAGNOSIS — E876 Hypokalemia: Secondary | ICD-10-CM | POA: Diagnosis not present

## 2022-03-13 DIAGNOSIS — I63432 Cerebral infarction due to embolism of left posterior cerebral artery: Secondary | ICD-10-CM | POA: Diagnosis not present

## 2022-03-13 DIAGNOSIS — F0390 Unspecified dementia without behavioral disturbance: Secondary | ICD-10-CM | POA: Diagnosis present

## 2022-03-13 DIAGNOSIS — E785 Hyperlipidemia, unspecified: Secondary | ICD-10-CM

## 2022-03-13 DIAGNOSIS — F32A Depression, unspecified: Secondary | ICD-10-CM

## 2022-03-13 DIAGNOSIS — I10 Essential (primary) hypertension: Secondary | ICD-10-CM | POA: Diagnosis not present

## 2022-03-13 DIAGNOSIS — N390 Urinary tract infection, site not specified: Secondary | ICD-10-CM | POA: Diagnosis present

## 2022-03-13 LAB — DIFFERENTIAL
Abs Immature Granulocytes: 0.03 10*3/uL (ref 0.00–0.07)
Basophils Absolute: 0.1 10*3/uL (ref 0.0–0.1)
Basophils Relative: 1 %
Eosinophils Absolute: 0.1 10*3/uL (ref 0.0–0.5)
Eosinophils Relative: 2 %
Immature Granulocytes: 0 %
Lymphocytes Relative: 30 %
Lymphs Abs: 2.3 10*3/uL (ref 0.7–4.0)
Monocytes Absolute: 0.8 10*3/uL (ref 0.1–1.0)
Monocytes Relative: 10 %
Neutro Abs: 4.3 10*3/uL (ref 1.7–7.7)
Neutrophils Relative %: 57 %

## 2022-03-13 LAB — COMPREHENSIVE METABOLIC PANEL
ALT: 17 U/L (ref 0–44)
AST: 16 U/L (ref 15–41)
Albumin: 3.4 g/dL — ABNORMAL LOW (ref 3.5–5.0)
Alkaline Phosphatase: 170 U/L — ABNORMAL HIGH (ref 38–126)
Anion gap: 13 (ref 5–15)
BUN: 6 mg/dL — ABNORMAL LOW (ref 8–23)
CO2: 20 mmol/L — ABNORMAL LOW (ref 22–32)
Calcium: 8.3 mg/dL — ABNORMAL LOW (ref 8.9–10.3)
Chloride: 106 mmol/L (ref 98–111)
Creatinine, Ser: 1.1 mg/dL — ABNORMAL HIGH (ref 0.44–1.00)
GFR, Estimated: 54 mL/min — ABNORMAL LOW (ref >60)
Glucose, Bld: 242 mg/dL — ABNORMAL HIGH (ref 70–99)
Potassium: 3 mmol/L — ABNORMAL LOW (ref 3.5–5.1)
Sodium: 139 mmol/L (ref 135–145)
Total Bilirubin: 1.3 mg/dL — ABNORMAL HIGH (ref 0.3–1.2)
Total Protein: 6.1 g/dL — ABNORMAL LOW (ref 6.5–8.1)

## 2022-03-13 LAB — URINALYSIS, ROUTINE W REFLEX MICROSCOPIC
Bilirubin Urine: NEGATIVE
Glucose, UA: >=500 mg/dL — AB
Hgb urine dipstick: NEGATIVE
Ketones, ur: 20 mg/dL — AB
Nitrite: NEGATIVE
Protein, ur: NEGATIVE mg/dL
Specific Gravity, Urine: 1.028 (ref 1.005–1.030)
pH: 5 (ref 5.0–8.0)

## 2022-03-13 LAB — PHOSPHORUS
Phosphorus: 1.8 mg/dL — ABNORMAL LOW (ref 2.5–4.6)
Phosphorus: 2.3 mg/dL — ABNORMAL LOW (ref 2.5–4.6)

## 2022-03-13 LAB — ECHOCARDIOGRAM COMPLETE
AR max vel: 3.05 cm2
AV Area VTI: 2.99 cm2
AV Area mean vel: 3.14 cm2
AV Mean grad: 5 mmHg
AV Peak grad: 9.5 mmHg
Ao pk vel: 1.54 m/s
Area-P 1/2: 2.17 cm2
Height: 69 in
MV VTI: 2.81 cm2
S' Lateral: 3.9 cm
Weight: 3112 oz

## 2022-03-13 LAB — LIPID PANEL
Cholesterol: 126 mg/dL (ref 0–200)
HDL: 36 mg/dL — ABNORMAL LOW (ref >40)
LDL Cholesterol: 60 mg/dL (ref 0–99)
Total CHOL/HDL Ratio: 3.5 RATIO
Triglycerides: 150 mg/dL — ABNORMAL HIGH (ref <150)
VLDL: 30 mg/dL (ref 0–40)

## 2022-03-13 LAB — CBG MONITORING, ED
Glucose-Capillary: 154 mg/dL — ABNORMAL HIGH (ref 70–99)
Glucose-Capillary: 171 mg/dL — ABNORMAL HIGH (ref 70–99)
Glucose-Capillary: 248 mg/dL — ABNORMAL HIGH (ref 70–99)

## 2022-03-13 LAB — CBC
HCT: 39.2 % (ref 36.0–46.0)
Hemoglobin: 13.1 g/dL (ref 12.0–15.0)
MCH: 30.3 pg (ref 26.0–34.0)
MCHC: 33.4 g/dL (ref 30.0–36.0)
MCV: 90.5 fL (ref 80.0–100.0)
Platelets: 216 10*3/uL (ref 150–400)
RBC: 4.33 MIL/uL (ref 3.87–5.11)
RDW: 12.8 % (ref 11.5–15.5)
WBC: 7.6 10*3/uL (ref 4.0–10.5)
nRBC: 0 % (ref 0.0–0.2)

## 2022-03-13 LAB — TSH: TSH: 1.968 u[IU]/mL (ref 0.350–4.500)

## 2022-03-13 LAB — TROPONIN I (HIGH SENSITIVITY): Troponin I (High Sensitivity): 20 ng/L — ABNORMAL HIGH (ref <18)

## 2022-03-13 LAB — HEMOGLOBIN A1C
Hgb A1c MFr Bld: 11 % — ABNORMAL HIGH (ref 4.8–5.6)
Mean Plasma Glucose: 269 mg/dL

## 2022-03-13 LAB — GLUCOSE, CAPILLARY
Glucose-Capillary: 233 mg/dL — ABNORMAL HIGH (ref 70–99)
Glucose-Capillary: 194 mg/dL — ABNORMAL HIGH (ref 70–99)
Glucose-Capillary: 226 mg/dL — ABNORMAL HIGH (ref 70–99)

## 2022-03-13 LAB — CORTISOL-AM, BLOOD: Cortisol - AM: 7.3 ug/dL (ref 6.7–22.6)

## 2022-03-13 LAB — MAGNESIUM
Magnesium: 1.5 mg/dL — ABNORMAL LOW (ref 1.7–2.4)
Magnesium: 1.5 mg/dL — ABNORMAL LOW (ref 1.7–2.4)

## 2022-03-13 MED ORDER — SODIUM CHLORIDE 0.9 % IV SOLN: INTRAVENOUS | Status: DC

## 2022-03-13 MED ORDER — SODIUM CHLORIDE 0.9 % IV SOLN
2.0000 g | Freq: Every day | INTRAVENOUS | Status: DC
  Administered 2022-03-13 (×2): 2 g via INTRAVENOUS
  Filled 2022-03-13 (×3): qty 20

## 2022-03-13 MED ORDER — ACETAMINOPHEN 160 MG/5ML PO SOLN: 650.0000 mg | ORAL | Status: DC | PRN

## 2022-03-13 MED ORDER — ASPIRIN 300 MG RE SUPP: 300.0000 mg | Freq: Every day | RECTAL | Status: DC

## 2022-03-13 MED ORDER — ACETAMINOPHEN 325 MG PO TABS
650.0000 mg | ORAL_TABLET | ORAL | Status: DC | PRN
  Administered 2022-03-13: 650 mg via ORAL
  Filled 2022-03-13: qty 2

## 2022-03-13 MED ORDER — STROKE: EARLY STAGES OF RECOVERY BOOK: Freq: Once | Status: AC

## 2022-03-13 MED ORDER — IOHEXOL 350 MG/ML SOLN
75.0000 mL | Freq: Once | INTRAVENOUS | Status: AC | PRN
  Administered 2022-03-13: 75 mL via INTRAVENOUS

## 2022-03-13 MED ORDER — POTASSIUM PHOSPHATES 15 MMOLE/5ML IV SOLN
20.0000 mmol | Freq: Once | INTRAVENOUS | Status: AC
  Administered 2022-03-13: 20 mmol via INTRAVENOUS
  Filled 2022-03-13: qty 6.67

## 2022-03-13 MED ORDER — ATORVASTATIN CALCIUM 80 MG PO TABS
80.0000 mg | ORAL_TABLET | Freq: Every day | ORAL | Status: DC
  Administered 2022-03-13 – 2022-03-15 (×3): 80 mg via ORAL
  Filled 2022-03-13 (×3): qty 1

## 2022-03-13 MED ORDER — ACETAMINOPHEN 650 MG RE SUPP: 650.0000 mg | RECTAL | Status: DC | PRN

## 2022-03-13 MED ORDER — ASPIRIN 325 MG PO TABS
325.0000 mg | ORAL_TABLET | Freq: Every day | ORAL | Status: DC
  Administered 2022-03-13: 325 mg via ORAL
  Filled 2022-03-13: qty 1

## 2022-03-13 MED ORDER — CLOPIDOGREL BISULFATE 75 MG PO TABS
75.0000 mg | ORAL_TABLET | Freq: Every day | ORAL | Status: DC
  Administered 2022-03-13 – 2022-03-15 (×3): 75 mg via ORAL
  Filled 2022-03-13 (×3): qty 1

## 2022-03-13 MED ORDER — ASPIRIN EC 81 MG PO TBEC
81.0000 mg | DELAYED_RELEASE_TABLET | Freq: Every day | ORAL | Status: DC
  Administered 2022-03-14 – 2022-03-15 (×2): 81 mg via ORAL
  Filled 2022-03-13 (×2): qty 1

## 2022-03-13 NOTE — Assessment & Plan Note (Signed)
-  Urinalysis done that showed a clear appearance with greater than 500 glucose, 20 ketones, small leukocytes, negative nitrites, few bacteria, 0-5 squamous epithelial cells, 0-5 RBCs per high-power field and 21-50 WBCs ?-Urine culture is pending ?-Initiated on empiric antibiotics with IV ceftriaxone we will continue for now ?-Patient is currently afebrile has no leukocytosis as WBC is 7.6 ?

## 2022-03-13 NOTE — Progress Notes (Signed)
?  Echocardiogram ?2D Echocardiogram has been performed. ? ?Rachael Lane ?03/13/2022, 1:50 PM ?

## 2022-03-13 NOTE — Evaluation (Addendum)
Speech Language Pathology Evaluation ?Patient Details ?Name: Rachael Lane ?MRN: [id number] ?DOB: [birth year] ?Today's Date: 03/13/2022 ?Time: 3903-0092 ?SLP Time Calculation (min) (ACUTE ONLY): 19 min ? ?Problem List:  ?Patient Active Problem List  ? Diagnosis Date Noted  ? UTI (urinary tract infection) 03/13/2022  ? Dementia without behavioral disturbance (HCC) 03/13/2022  ? Hypomagnesemia 03/13/2022  ? Hypophosphatemia 03/13/2022  ? Depression   ? Stroke (cerebrum) (HCC) 03/12/2022  ? Essential hypertension 03/12/2022  ? Hyperlipidemia 03/12/2022  ? DM2 (diabetes mellitus, type 2) (HCC) 03/12/2022  ? Hypokalemia 03/12/2022  ? ?Past Medical History:  ?Past Medical History:  ?Diagnosis Date  ? Arthritis   ? Cataract   ? Depression   ? Diabetes mellitus without complication (HCC)   ? Hyperlipidemia   ? Hypertension   ? ?Past Surgical History: History reviewed. No pertinent surgical history. ?HPI:  ?Rachael Lane is a 71 y.o. female who presented with confusion. Rachael Lane reportedly fell off her cough two days prior to admission and was unable to get up thereafter. Difficulted with word retrieval noted by EMS. MRI brain 4/4: Small acute infarct of the left thalamus extending into the left paramedian midbrain. PMH: Obesity diabetes type 2, depression, HLD, HTN, mild dementia.  ? ?Assessment / Plan / Recommendation ?Clinical Impression ? Rachael Lane participated in speech-language-cognition evaluation. Rachael Lane's reliability as a historian is somewhat questioned and she does have a diagnosis of mild dementia. She denied any baseline deficits or acute changes in speech, language, or cognition. She reported that she lives alone and was independent with medication and financial management prior to admission. Motor speech and language skills were WFL, but repetition was intermittently necessary for completion of commands. The [facility] Mental Status Examination was completed to evaluate the Rachael Lane's cognitive-linguistic skills. She achieved a score  of 1/30 which is below the normal limits of 27 or more out of 30. She exhibited deficits in the areas of awareness, orientation, attention, memory, problem solving, and executive function. Skilled SLP services are clinically indicated at this time to improve cognitive-linguistic function. ?   ?SLP Assessment ? SLP Recommendation/Assessment: Patient needs continued Speech Lanaguage Pathology Services ?SLP Visit Diagnosis: Cognitive communication deficit (R41.841)  ?  ?Recommendations for follow up therapy are one component of a multi-disciplinary discharge planning process, led by the attending physician.  Recommendations may be updated based on patient status, additional functional criteria and insurance authorization. ?   ?Follow Up Recommendations ?  (Contnued SLP services at level of care recommended by Rachael Lane/OT)  ?  ?Assistance Recommended at Discharge ? Frequent or constant Supervision/Assistance  ?Functional Status Assessment Patient has had a recent decline in their functional status and demonstrates the ability to make significant improvements in function in a reasonable and predictable amount of time.  ?Frequency and Duration min 2x/week  ?2 weeks ?  ?   ?SLP Evaluation ?Cognition ? Overall Cognitive Status: Impaired/Different from baseline ?Arousal/Alertness: Awake/alert ?Orientation Level: Oriented to person;Disoriented to time;Disoriented to situation ?Year:  (1980) ?Month: September ?Day of Week: Incorrect ?Attention: Focused;Sustained ?Focused Attention: Appears intact ?Sustained Attention: Impaired ?Sustained Attention Impairment: Verbal complex ?Memory: Impaired ?Memory Impairment: Retrieval deficit;Decreased short term memory;Storage deficit (Immediate: 2/5 despite repetition; delayed: 0/5; with cues: 1/5) ?Awareness: Impaired ?Awareness Impairment: Intellectual impairment ?Problem Solving: Impaired ?Problem Solving Impairment: Verbal complex (Money: 0/3) ?Executive Function:  Sequencing;Organizing ?Sequencing: Impaired ?Sequencing Impairment: Verbal complex (clock drawing:0/4) ?Organizing: Impaired ?Organizing Impairment: Verbal complex (backward digit span: 0/4)  ?  ?   ?Comprehension ? Auditory Comprehension ?Overall Auditory Comprehension:  Appears within functional limits for tasks assessed ?Yes/No Questions: Within Functional Limits ?Commands: Within Functional Limits  ?  ?Expression Expression ?Primary Mode of Expression: Verbal ?Verbal Expression ?Overall Verbal Expression: Appears within functional limits for tasks assessed ?Initiation: No impairment ?Level of Generative/Spontaneous Verbalization: Conversation ?Repetition: No impairment ?Naming: No impairment ?Pragmatics: No impairment   ?Oral / Motor ? Oral Motor/Sensory Function ?Overall Oral Motor/Sensory Function: Within functional limits ?Motor Speech ?Overall Motor Speech: Appears within functional limits for tasks assessed ?Respiration: Within functional limits ?Phonation: Normal ?Resonance: Within functional limits ?Articulation: Within functional limitis ?Intelligibility: Intelligible ?Motor Planning: Witnin functional limits ?Motor Speech Errors: Not applicable   ?        ?Kattleya Kuhnert I. Vear Clock, MS, CCC-SLP ?Acute Rehabilitation Services ?Office number [phone] ?Pager [phone] ? ?Scheryl Marten ?03/13/2022, 10:14 AM ? ? ? ?

## 2022-03-13 NOTE — Assessment & Plan Note (Signed)
-  K+ was 2.6 ?-Replete with IV K Phos 20 mmol and IV KCl 40 mEQ ?-Continue to Monitor and Replete as Necessary ?-Repeat CMP in the AM  ?

## 2022-03-13 NOTE — ED Notes (Signed)
Pt given a diet coke. 

## 2022-03-13 NOTE — Assessment & Plan Note (Signed)
-  Once Safe to swallow resume Citalopram 40 mg po qHS and Doxepin 25 mg po qHS ?

## 2022-03-13 NOTE — Evaluation (Signed)
Clinical/Bedside Swallow Evaluation ?Patient Details  ?Name: Rachael Lane ?MRN: [id number] ?Date of Birth: [birth year] ? ?Today's Date: 03/13/2022 ?Time: SLP Start Time (ACUTE ONLY): [id number] SLP Stop Time (ACUTE ONLY): 0902 ?SLP Time Calculation (min) (ACUTE ONLY): 8 min ? ?Past Medical History:  ?Past Medical History:  ?Diagnosis Date  ? Arthritis   ? Cataract   ? Depression   ? Diabetes mellitus without complication ([place])   ? Hyperlipidemia   ? Hypertension   ? ?Past Surgical History: History reviewed. No pertinent surgical history. ?HPI:  ?Pt is a 71 y.o. female who presented with confusion. Pt reportedly fell off her cough two days prior to admission and was unable to get up thereafter. Difficulted with word retrieval noted by EMS. MRI brain 4/4: Small acute infarct of the left thalamus extending into the left paramedian midbrain. PMH: Obesity diabetes type 2, depression, HLD, HTN, mild dementia.  ?  ?Assessment / Plan / Recommendation  ?Clinical Impression ? Pt was seen for bedside swallow evaluation. Her reliability as a historian is somewhat questioned, but she denied a history of dysphagia. Oral mechanism exam was [facility] and she was edentulous. Pt reported that she owns dentures, but that they are not currently at the hospital, and that she only eats softer foods without them. She tolerated all solids and liquids without signs or symptoms of aspiration. Mastication was mildly prolonged with regular texture solids secondary to pt's dental status. The possibility of initiating a regular texture diet and allowing the pt to choose foods with which she is comfortable was discussed, but pt stated that she would prefer that her diet be soft until her dentures are brought to the hospital. A dysphagia 3 diet with thin liquids is recommended at this time and SLP will follow pt. ?SLP Visit Diagnosis: Dysphagia, unspecified (R13.10) ?   ?Aspiration Risk ? Mild aspiration risk  ?  ?Diet Recommendation Dysphagia 3 (Mech soft);Thin  liquid  ? ?Liquid Administration via: Cup;Straw ?Medication Administration: Whole meds with liquid ?Supervision: Patient able to self feed ?Postural Changes: Seated upright at 90 degrees  ?  ?Other  Recommendations Oral Care Recommendations: Oral care BID   ? ?Recommendations for follow up therapy are one component of a multi-disciplinary discharge planning process, led by the attending physician.  Recommendations may be updated based on patient status, additional functional criteria and insurance authorization. ? ?Follow up Recommendations  (Continued SLP services at level of care recommended by PT/OT)  ? ? ?  ?Assistance Recommended at Discharge Frequent or constant Supervision/Assistance  ?Functional Status Assessment Patient has had a recent decline in their functional status and demonstrates the ability to make significant improvements in function in a reasonable and predictable amount of time.  ?Frequency and Duration min 2x/week  ?2 weeks ?  ?   ? ?Prognosis Prognosis for Safe Diet Advancement: Good  ? ?  ? ?Swallow Study   ?General Date of Onset: 03/13/22 ?HPI: Pt is a 71 y.o. female who presented with confusion. Pt reportedly fell off her cough two days prior to admission and was unable to get up thereafter. Difficulted with word retrieval noted by EMS. MRI brain 4/4: Small acute infarct of the left thalamus extending into the left paramedian midbrain. PMH: Obesity diabetes type 2, depression, HLD, HTN, mild dementia. ?Type of Study: Bedside Swallow Evaluation ?Previous Swallow Assessment: none ?Diet Prior to this Study: NPO ?Temperature Spikes Noted: No ?Respiratory Status: Room air ?History of Recent Intubation: No ?Behavior/Cognition: Alert;Cooperative;Pleasant mood ?Oral Cavity Assessment: Within Functional  Limits ?Oral Care Completed by SLP: No ?Oral Cavity - Dentition: Edentulous ?Vision: Functional for self-feeding ?Self-Feeding Abilities: Able to feed self ?Patient Positioning: Upright in  bed;Postural control adequate for testing ?Baseline Vocal Quality: Normal ?Volitional Cough: Strong ?Volitional Swallow: Able to elicit  ?  ?Oral/Motor/Sensory Function Overall Oral Motor/Sensory Function: Within functional limits   ?Ice Chips Ice chips: Not tested   ?Thin Liquid Thin Liquid: Within functional limits ?Presentation: Straw;Cup  ?  ?Nectar Thick Nectar Thick Liquid: Not tested   ?Honey Thick Honey Thick Liquid: Not tested   ?Puree Puree: Within functional limits ?Presentation: Spoon   ?Solid ? ? ?  Solid: Impaired ?Presentation: Self Fed ?Oral Phase Impairments: Impaired mastication  ? ?  ?Rachael Lane I. Rachael Lane, Rachael Lane, CCC-SLP ?Acute Rehabilitation Services ?Office number [phone] ?Pager [phone] ? ?Rachael Lane ?03/13/2022,10:06 AM ? ? ? ? ? ?

## 2022-03-13 NOTE — Hospital Course (Signed)
The patient is an overweight 71 year old Caucasian female with a past medical history significant for below 2 diabetes mellitus type 2, depression, hyperlipidemia, hypertension, history dementia without with behavioral disturbances, as well as other comorbidities who was transferred from [facility] after she was found on the floor by her friend with an unknown downtime and had difficulty with speech.  She was last seen normal on Sunday, April second 2023 and was found by her friend brought into the ED for further evaluation.  She had speech difficulty with mostly word finding and some slurred speech and was outside the window for intervention.  She was able to tell the ED provider that she fell from a couch 2 days ago but cannot tell much of the information about that and then did tell us that her friend found her.  The friend reported that she has medications from her pharmacy in blister packs and they were unaware but since Thursday last week.  It is unclear if she is compliant to medications and medical treatment but further work-up was done while she was in the ED and the head CT was concerning for left thalamic subacute infarct and MRI of the brain was done and showed an acute to subacute left thalamic infarct extending into the midbrain of the left.  Neurology was consulted and recommended further work-up and she is currently undergoing further investigation of her CVA and currently awaiting PT, OT, SLP evaluations as well as echocardiogram.  Given that she lives alone and has cognitive deficits now secondary to her CVA she likely needs SNF placement.  ?

## 2022-03-13 NOTE — Evaluation (Signed)
Physical Therapy Evaluation ?Patient Details ?Name: Rachael Lane ?MRN: [id number] ?DOB: 01-02-1951 ?Today's Date: 03/13/2022 ? ?History of Present Illness ? The pt is a 71 yo female presenting 4/4 from home after being found down on the floor for an unknown period of time by a friend. 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 PMH includes: arthritis, depression, DM II, HLD, and HTN. ?  ?Clinical Impression ? Pt in bed upon arrival of PT, agreeable to evaluation at this time. Prior to admission the pt was living alone, states she does not typically need DME or any assist with mobility or ADLs. The pt now presents with limitations in functional mobility, activity tolerance, coordination, power, and stability due to above dx, and will continue to benefit from skilled PT to address these deficits. She was able to complete bed mobility with minA, but required modA to power up to standing and to maintain stability once standing. Pt with posterior and R lateral lean and LOB. The pt was challenged by taking steps at EOB and required increased assist and took small steps with inconsistent stride, clearance, and positioning. Will benefit from continued skilled PT acutely as well as SNF rehab at d/c to maximize recovery and return to independence.  ?   ?   ? ?Recommendations for follow up therapy are one component of a multi-disciplinary discharge planning process, led by the attending physician.  Recommendations may be updated based on patient status, additional functional criteria and insurance authorization. ? ?Follow Up Recommendations Skilled nursing-short term rehab (<3 hours/day) ? ?  ?Assistance Recommended at Discharge Frequent or constant Supervision/Assistance  ?Patient can return home with the following ? Two people to help with walking and/or transfers;A lot of help with bathing/dressing/bathroom;Assistance with cooking/housework;Direct supervision/assist for medications management;Direct  supervision/assist for financial management;Assist for transportation;Help with stairs or ramp for entrance ? ?  ?Equipment Recommendations Rolling walker (2 wheels)  ?Recommendations for Other Services ?    ?  ?Functional Status Assessment Patient has had a recent decline in their functional status and demonstrates the ability to make significant improvements in function in a reasonable and predictable amount of time.  ? ?  ?Precautions / Restrictions Precautions ?Precautions: Fall ?Precaution Comments: pt reports double vision ?Restrictions ?Weight Bearing Restrictions: No  ? ?  ? ?Mobility ? Bed Mobility ?Overal bed mobility: Needs Assistance ?Bed Mobility: Supine to Sit, Sit to Supine ?  ?  ?Supine to sit: Min guard ?Sit to supine: Min assist ?  ?General bed mobility comments: minG with increased time to come to sitting, minA to reposition in bed ?  ? ?Transfers ?Overall transfer level: Needs assistance ?Equipment used: 1 person hand held assist ?Transfers: Sit to/from Stand ?Sit to Stand: Mod assist ?  ?  ?  ?  ?  ?General transfer comment: modA to power up, posterior lean with initial stand and R lateral lean ?  ? ?Ambulation/Gait ?Ambulation/Gait assistance: Mod assist ?Gait Distance (Feet): 3 Feet ?Assistive device: 1 person hand held assist ?Gait Pattern/deviations: Step-to pattern ?  ?  ?  ?General Gait Details: small lateral steps along EOB, modA to steady as pt leaning posteriorly and to R. inconsistent steps with each leg. ? ?  ? ?Balance Overall balance assessment: Needs assistance ?Sitting-balance support: Single extremity supported, Feet supported ?Sitting balance-Leahy Scale: Fair ?  ?Postural control: Posterior lean ?Standing balance support: Single extremity supported, During functional activity ?Standing balance-Leahy Scale: Poor ?Standing balance comment: dependent on UE support and modA ?  ?  ?  ?  ?  ?  ?  ?  ?  ?  ?  ?   ? ? ? ?  Pertinent Vitals/Pain Pain Assessment ?Pain Assessment: No/denies  pain  ? ? ?Home Living Family/patient expects to be discharged to:: Private residence ?Living Arrangements: Alone ?Available Help at Discharge: Available PRN/intermittently (pt unsure, wants to think about it) ?Type of Home: House ?Home Access: Level entry ?  ?  ?  ?Home Layout: One level ?Home Equipment: Cane - quad ?   ?  ?Prior Function Prior Level of Function : Independent/Modified Independent;Driving ?  ?  ?  ?  ?  ?  ?Mobility Comments: pt reports typically no DME, no falls in last 6 months ?ADLs Comments: pt reports independence ?  ? ? ?Hand Dominance  ? Dominant Hand: Right ? ?  ?Extremity/Trunk Assessment  ? Upper Extremity Assessment ?Upper Extremity Assessment: Defer to OT evaluation ?  ? ?Lower Extremity Assessment ?Lower Extremity Assessment: RLE deficits/detail;LLE deficits/detail ?RLE Deficits / Details: grossly functional against MMT, and reports no change in sensation. ?RLE Sensation: WNL ?RLE Coordination: decreased fine motor;decreased gross motor ?LLE Deficits / Details: grossly functional against MMT, and reports no change in sensation. ?LLE Sensation: WNL ?LLE Coordination: decreased fine motor;decreased gross motor ?  ? ?Cervical / Trunk Assessment ?Cervical / Trunk Assessment: Kyphotic  ?Communication  ? Communication: No difficulties  ?Cognition Arousal/Alertness: Awake/alert ?Behavior During Therapy: [facility] for tasks assessed/performed ?Overall Cognitive Status: Impaired/Different from baseline ?Area of Impairment: Safety/judgement, Awareness, Problem solving ?  ?  ?  ?  ?  ?  ?  ?  ?  ?  ?  ?  ?Safety/Judgement: Decreased awareness of safety, Decreased awareness of deficits ?Awareness: Intellectual ?Problem Solving: Slow processing, Decreased initiation, Difficulty sequencing ?General Comments: pt benefits from increased time and cues, at times answering "I don't know" to history questions, and other times answering, no family to confirm ?  ?  ? ?  ?General Comments General comments (skin  integrity, edema, etc.): VSS on RA ? ?  ?   ? ?Assessment/Plan  ?  ?PT Assessment Patient needs continued PT services  ?PT Problem List Decreased strength;Decreased range of motion;Decreased activity tolerance;Decreased balance;Decreased coordination;Decreased safety awareness ? ?   ?  ?PT Treatment Interventions DME instruction;Gait training;Stair training;Functional mobility training;Therapeutic activities;Therapeutic exercise;Balance training;Patient/family education   ? ?PT Goals (Current goals can be found in the Care Plan section)  ?Acute Rehab PT Goals ?Patient Stated Goal: to feel better ?PT Goal Formulation: With patient ?Time For Goal Achievement: 03/27/22 ?Potential to Achieve Goals: Good ? ?  ?Frequency Min 3X/week ?  ? ? ?   ?AM-PAC PT "6 Clicks" Mobility  ?Outcome Measure Help needed turning from your back to your side while in a flat bed without using bedrails?: A Little ?Help needed moving from lying on your back to sitting on the side of a flat bed without using bedrails?: A Little ?Help needed moving to and from a bed to a chair (including a wheelchair)?: A Lot ?Help needed standing up from a chair using your arms (e.g., wheelchair or bedside chair)?: A Lot ?Help needed to walk in hospital room?: A Lot ?Help needed climbing 3-5 steps with a railing? : A Lot ?6 Click Score: 14 ? ?  ?End of Session Equipment Utilized During Treatment: Gait belt ?Activity Tolerance: Patient tolerated treatment well;Patient limited by fatigue ?Patient left: in bed;with call bell/phone within reach ?Nurse Communication: Mobility status ?PT Visit Diagnosis: Other abnormalities of gait and mobility (R26.89);Muscle weakness (generalized) (M62.81);History of falling (Z91.81) ?  ? ?Time: 1001-1014 ?PT Time Calculation (min) (ACUTE ONLY): 13 min ? ? ?  Charges:   PT Evaluation ?$PT Eval Moderate Complexity: 1 Mod ?  ?  ?   ? ? ?Vickki MuffKatie Zhou, PT, DPT  ? ?Acute Rehabilitation Department ?Pager #: [phone] ? ?Ronnie DerbyKatie F  Zhou ?03/13/2022, 11:06 AM ? ?

## 2022-03-13 NOTE — Assessment & Plan Note (Signed)
-  Allowing for permissive hypertension recurrent ?-Allow for normalization upon discharge/systolic blood pressure less than 140 ?-Continue monitor blood pressures per protocol ?-Last blood pressure reading was 155/88 ?

## 2022-03-13 NOTE — TOC Initial Note (Addendum)
Transition of Care (TOC) - Initial/Assessment Note  ? ? ?Patient Details  ?Name: Rachael Lane ?MRN: [id number] ?Date of Birth: 1951-01-21 ? ?Transition of Care (TOC) CM/SW Contact:    ?Carley Hammed, LCSWA ?Phone Number: ?03/13/2022, 1:19 PM ? ?Clinical Narrative:                 ?CSW received a return call from [name] who notes she is pt's POA, and she will email paperwork. She is agreeable to SNF with a preference for [facility] in [place]. She would prefer an [facility] facility as [name] lives there. [name] noted she does not know if pt has had any covid vaccines. She states that pt may be a DNR, but will check her paperwork just in case. Pt has never been to SNF to [name]'s knowledge. TOC will continue to follow for DC needs. ? ?CSW noted recommendation for SNF at discharge. Pt is disoriented at this time, CSW attempted to speak with emergency contacts in the chart , listed as friends. CSW left VM for both. TOC will continue to follow for DC needs. ? ?Expected Discharge Plan: Skilled Nursing Facility ?Barriers to Discharge: SNF Pending bed offer, Insurance Authorization, Family Issues, Continued Medical Work up ? ? ?Patient Goals and CMS Choice ?Patient states their goals for this hospitalization and ongoing recovery are:: Pt unable to participate in goal setting due to disorientation. ?CMS Medicare.gov Compare Post Acute Care list provided to:: Patient ?Choice offered to / list presented to : Patient ? ?Expected Discharge Plan and Services ?Expected Discharge Plan: Skilled Nursing Facility ?  ?  ?Post Acute Care Choice: Skilled Nursing Facility ?Living arrangements for the past 2 months: Single Family Home ?                ?  ?  ?  ?  ?  ?  ?  ?  ?  ?  ? ?Prior Living Arrangements/Services ?Living arrangements for the past 2 months: Single Family Home ?Lives with:: Self ?Patient language and need for interpreter reviewed:: Yes ?Do you feel safe going back to the place where you live?: Yes      ?Need for Family Participation  in Patient Care: Yes (Comment) ?Care giver support system in place?: No (comment) ?  ?Criminal Activity/Legal Involvement Pertinent to Current Situation/Hospitalization: No - Comment as needed ? ?Activities of Daily Living ?Home Assistive Devices/Equipment: None ?ADL Screening (condition at time of admission) ?Patient's cognitive ability adequate to safely complete daily activities?: Yes ?Is the patient deaf or have difficulty hearing?: No ?Does the patient have difficulty seeing, even when wearing glasses/contacts?: No ?Does the patient have difficulty concentrating, remembering, or making decisions?: Yes ?Patient able to express need for assistance with ADLs?: No ?Does the patient have difficulty dressing or bathing?: No ?Independently performs ADLs?: Yes (appropriate for developmental age) ?Does the patient have difficulty walking or climbing stairs?: Yes ?Weakness of Legs: Both ?Weakness of Arms/Hands: None ? ?Permission Sought/Granted ?Permission sought to share information with : Family Supports ?  ?   ?   ?   ?   ? ?Emotional Assessment ?Appearance:: Appears stated age ?Attitude/Demeanor/Rapport: Unable to Assess ?Affect (typically observed): Unable to Assess ?Orientation: : Oriented to Self, Oriented to Place ?Alcohol / Substance Use: Not Applicable ?Psych Involvement: No (comment) ? ?Admission diagnosis:  Stroke (cerebrum) (HCC) [I63.9] ?Patient Active Problem List  ? Diagnosis Date Noted  ? UTI (urinary tract infection) 03/13/2022  ? Dementia without behavioral disturbance (HCC) 03/13/2022  ? Hypomagnesemia 03/13/2022  ?  Hypophosphatemia 03/13/2022  ? Depression   ? Stroke (cerebrum) (HCC) 03/12/2022  ? Essential hypertension 03/12/2022  ? Hyperlipidemia 03/12/2022  ? DM2 (diabetes mellitus, type 2) (HCC) 03/12/2022  ? Hypokalemia 03/12/2022  ? ?PCP:  Simone Curia, MD ?Pharmacy:   ?[facility] - [place], [place] - [address] ?[address] ?[place] [address] ?Phone: [phone] Fax:  [phone] ? ? ? ? ?Social Determinants of Health (SDOH) Interventions ?  ? ?Readmission Risk Interventions ?   ? View : No data to display.  ?  ?  ?  ? ? ? ?

## 2022-03-13 NOTE — Assessment & Plan Note (Signed)
-  Hemoglobin A1c is pending ?-Continue to monitor CBGs and CBGs ranging from 171-248 ?-Initiated on sensitive NovoLog sign scale insulin every 4 and then will change before meals and at bedtime once cleared by SLP and tolerating a diet ?

## 2022-03-13 NOTE — Assessment & Plan Note (Signed)
-  Lipid panel as above ?-Initiated on high intensity atorvastatin 80 mg p.o. daily ?

## 2022-03-13 NOTE — Assessment & Plan Note (Signed)
-  Mag Level was 1.5 ?-Replete with IV Mag Sulfate 2 grams ?-Continue to Monitor and Replete as Necessary ?-Repeat Mag Level in the AM ?

## 2022-03-13 NOTE — Assessment & Plan Note (Signed)
-  Continue with prior to admit donepezil 10 mg p.o. daily and doxepin 25 mg p.o. nightly ?

## 2022-03-13 NOTE — Progress Notes (Signed)
?PROGRESS NOTE ? ? ? ?Rachael Lane  WFU:[id number] DOB: 1951-03-16 DOA: 03/12/2022 ?PCP: Simone Curia, MD  ? ?Brief Narrative:  ?The patient is an overweight 71 year old Caucasian female with a past medical history significant for below 2 diabetes mellitus type 2, depression, hyperlipidemia, hypertension, history dementia without with behavioral disturbances, as well as other comorbidities who was transferred from [facility] after she was found on the floor by her friend with an unknown downtime and had difficulty with speech.  She was last seen normal on Sunday, April second 2023 and was found by her friend brought into the ED for further evaluation.  She had speech difficulty with mostly word finding and some slurred speech and was outside the window for intervention.  She was able to tell the ED provider that she fell from a couch 2 days ago but cannot tell much of the information about that and then did tell us that her friend found her.  The friend reported that she has medications from her pharmacy in blister packs and they were unaware but since Thursday last week.  It is unclear if she is compliant to medications and medical treatment but further work-up was done while she was in the ED and the head CT was concerning for left thalamic subacute infarct and MRI of the brain was done and showed an acute to subacute left thalamic infarct extending into the midbrain of the left.  Neurology was consulted and recommended further work-up and she is currently undergoing further investigation of her CVA and currently awaiting PT, OT, SLP evaluations as well as echocardiogram.  Given that she lives alone and has cognitive deficits now secondary to her CVA she likely needs SNF placement.   ? ? ?Assessment and Plan: ?* Stroke (cerebrum) (HCC) ?-Admitted to Hospitalist Service and admitted based on the TIA/CVA Protocol ?-C/w Telemetry monitoring ?-Stroke Head CT done and showed "Asymmetrical low-attenuation in the left  thalamus suggesting acute or subacute infarct. Consider MRI for further evaluation. Chronic atrophy and small vessel ischemic changes diffusely. No acute intracranial hemorrhage" ?-MRI Brain w/o Contrast showed "Small acute infarct of the left thalamus extending into the left paramedian midbrain. No hemorrhage or mass effect. Moderate chronic small vessel ischemic disease and generalized volume loss" ?-CTA Angio Head and Neck w/wo Contrast done and showed "Normal CTA of the head and neck" ?-Per Neuro allow permissive hypertension for the first 24 to 48 hours and only treat as needed if SBP is greater than 220 with blood pressures being normalized to less than 140 upon discharge ?-Echocardiogram has been ordered and pending to be done ?-Continue with neurochecks per protocol ?-Patient was initiated on aspirin 325 mg p.o. 1 200 once PR and then may need dual antiplatelet therapy and will defer to neurology for decision making ?-Initiated on high intensity Atorvastatin 80 mg p.o. daily ?-Continue neurochecks per protocol ?-Checking lipid panel and hemoglobin A1c ?-Lipid panel done and showed a total cholesterol/HDL level of 3.5, cholesterol 126, HDL 36, LDL 60, triglycerides 150, VLDL of 30 ?-Risk factor modification ?-Discussed the case with Neurology and the CVA has affected her vertical gaze and memory/cognition and since she lives alone will need placement  ?-PT consult, OT consult, Speech consult and pending but likely will need SNF given that she lives alone ?-If Afib found on telemetry, will need anticoagulation but will defer decision pending imaging and stroke team evaluation; Neurology recommending 30-Day Holter for D/C as they are likely going to defer Loop Recorder implantation given  her cognitive deficits  ?  ? ?Depression ?-Once Safe to swallow resume Citalopram 40 mg po qHS and Doxepin 25 mg po qHS ? ?Hypophosphatemia ?-Phos Level low at 1.8 ?-Replete with IV K Phos 20 mmol  ?-Continue to Monitor and  Replete as Necessary ?-Repeat Phos Level in the AM ? ?Hypomagnesemia ?-Mag Level was 1.5 ?-Replete with IV Mag Sulfate 2 grams ?-Continue to Monitor and Replete as Necessary ?-Repeat Mag Level in the AM ? ?Dementia without behavioral disturbance (HCC) ?-Continue with prior to admit donepezil 10 mg p.o. daily and doxepin 25 mg p.o. nightly ? ?UTI (urinary tract infection) ?-Urinalysis done that showed a clear appearance with greater than 500 glucose, 20 ketones, small leukocytes, negative nitrites, few bacteria, 0-5 squamous epithelial cells, 0-5 RBCs per high-power field and 21-50 WBCs ?-Urine culture is pending ?-Initiated on empiric antibiotics with IV ceftriaxone we will continue for now ?-Patient is currently afebrile has no leukocytosis as WBC is 7.6 ? ?Hypokalemia ?-K+ was 2.6 ?-Replete with IV K Phos 20 mmol and IV KCl 40 mEQ ?-Continue to Monitor and Replete as Necessary ?-Repeat CMP in the AM  ? ?DM2 (diabetes mellitus, type 2) (HCC) ?-Hemoglobin A1c is pending ?-Continue to monitor CBGs and CBGs ranging from 171-248 ?-Initiated on sensitive NovoLog sign scale insulin every 4 and then will change before meals and at bedtime once cleared by SLP and tolerating a diet ? ?Hyperlipidemia ?-Lipid panel as above ?-Initiated on high intensity atorvastatin 80 mg p.o. daily ? ?Essential hypertension ?-Allowing for permissive hypertension recurrent ?-Allow for normalization upon discharge/systolic blood pressure less than 140 ?-Continue monitor blood pressures per protocol ?-Last blood pressure reading was 155/88 ? ?DVT prophylaxis: SCD's Start: 03/13/22 0148 ? ?  Code Status: Full Code ?Family Communication: No family currently at bedside ? ?Disposition Plan:  ?Level of care: Telemetry Medical ?Status is: Inpatient ?Remains inpatient appropriate because: Had an acute CVA and has further work-up pending ?  ? ?Consultants:  ?Neurology ? ?Procedures:  ?As above ?ECHOCARDIOGRAM ? ?Antimicrobials:  ?Anti-infectives (From  admission, onward)  ? ? Start     Dose/Rate Route Frequency Ordered Stop  ? 03/13/22 0100  cefTRIAXone (ROCEPHIN) 2 g in sodium chloride 0.9 % 100 mL IVPB       ? 2 g ?200 mL/hr over 30 Minutes Intravenous Daily at bedtime 03/13/22 0058    ? ?  ?  ?Subjective: ?Seen and examined at bedside and she was able to tell me her name and answer some questions appropriately but has difficult time with her gaze and has memory and cognition deficits from her CVA.  She denies any complaints at this time and denies any nausea or vomiting.  No chest pain or shortness of breath.  No other concerns or complaints at this time. ? ?Objective: ?Vitals:  ? 03/13/22 0500 03/13/22 0515 03/13/22 0830 03/13/22 0836  ?BP: (!) 157/95  (!) 155/88   ?Pulse: 86 73 71   ?Resp: 19 15 18    ?Temp:    98.9 ?F (37.2 ?C)  ?TempSrc:    Oral  ?SpO2: 98% 98% 96%   ?Weight:      ?Height:      ? ? ?Intake/Output Summary (Last 24 hours) at 03/13/2022 0945 ?Last data filed at 03/13/2022 0458 ?Gross per 24 hour  ?Intake 300 ml  ?Output --  ?Net 300 ml  ? ?Filed Weights  ? 03/12/22 1936  ?Weight: 88.2 kg  ? ?Examination: ?Physical Exam: ? ?Constitutional: WN/WD overweight Caucasian female, in no  acute distress ?Eyes: Right eyeball is a little bit lower than her left ?Respiratory: Diminished to auscultation bilaterally, no wheezing, rales, rhonchi or crackles. Normal respiratory effort and patient is not tachypenic. No accessory muscle use.  Unlabored breathing ?Cardiovascular: RRR, no murmurs / rubs / gallops. S1 and S2 auscultated. No extremity edema.  ?Abdomen: Soft, non-tender, distended secondary body habitus. Bowel sounds positive.  ?GU: Deferred. ?Musculoskeletal: No clubbing / cyanosis of digits/nails. No joint deformity upper and lower extremities.   ?Neurologic: She moves her extremities independently.  Has a difficult time with her vertical gaze ?Psychiatric: Slightly impaired judgment and insight. Normal mood and appropriate affect.  ? ?Data Reviewed: I  have personally reviewed following labs and imaging studies ? ?CBC: ?Recent Labs  ?Lab 03/12/22 ?2021 03/13/22 ?0432  ?WBC 8.3 7.6  ?NEUTROABS 5.8 4.3  ?HGB 13.7 13.1  ?HCT 40.7 39.2  ?MCV 89.6 90.5  ?PLT 2

## 2022-03-13 NOTE — Assessment & Plan Note (Signed)
-   treat with Rocephin         await results of urine culture and adjust antibiotic coverage as needed  

## 2022-03-13 NOTE — Assessment & Plan Note (Addendum)
Monitor for any sundowning resume  home medications when able to tolerate ?

## 2022-03-13 NOTE — Plan of Care (Signed)
  Problem: Education: Goal: Knowledge of disease or condition will improve Outcome: Progressing   Problem: Nutrition: Goal: Risk of aspiration will decrease Outcome: Progressing Goal: Dietary intake will improve Outcome: Progressing   

## 2022-03-13 NOTE — Assessment & Plan Note (Signed)
-  Phos Level low at 1.8 ?-Replete with IV K Phos 20 mmol  ?-Continue to Monitor and Replete as Necessary ?-Repeat Phos Level in the AM ?

## 2022-03-13 NOTE — Assessment & Plan Note (Addendum)
-  Admitted to Hospitalist Service and admitted based on the TIA/CVA Protocol ?-C/w Telemetry monitoring ?-Stroke Head CT done and showed "Asymmetrical low-attenuation in the left thalamus suggesting acute or subacute infarct. Consider MRI for further evaluation. Chronic atrophy and small vessel ischemic changes diffusely. No acute intracranial hemorrhage" ?-MRI Brain w/o Contrast showed "Small acute infarct of the left thalamus extending into the left paramedian midbrain. No hemorrhage or mass effect. Moderate chronic small vessel ischemic disease and generalized volume loss" ?-CTA Angio Head and Neck w/wo Contrast done and showed "Normal CTA of the head and neck" ?-Per Neuro allow permissive hypertension for the first 24 to 48 hours and only treat as needed if SBP is greater than 220 with blood pressures being normalized to less than 140 upon discharge ?-Echocardiogram has been ordered and pending to be done ?-Continue with neurochecks per protocol ?-Patient was initiated on aspirin 325 mg p.o. 1 200 once PR and then may need dual antiplatelet therapy and will defer to neurology for decision making ?-Initiated on high intensity Atorvastatin 80 mg p.o. daily ?-Continue neurochecks per protocol ?-Checking lipid panel and hemoglobin A1c ?-Lipid panel done and showed a total cholesterol/HDL level of 3.5, cholesterol 126, HDL 36, LDL 60, triglycerides 150, VLDL of 30 ?-Risk factor modification ?-Discussed the case with Neurology and the CVA has affected her vertical gaze and memory/cognition and since she lives alone will need placement  ?-PT consult, OT consult, Speech consult and pending but likely will need SNF given that she lives alone ?-If Afib found on telemetry, will need anticoagulation but will defer decision pending imaging and stroke team evaluation; Neurology recommending 30-Day Holter for D/C as they are likely going to defer Loop Recorder implantation given her cognitive deficits  ?? ?

## 2022-03-13 NOTE — Progress Notes (Addendum)
STROKE TEAM PROGRESS NOTE  ? ?HISTORY OF PRESENT ILLNESS (per record) ?Rachael Lane is a 71 y.o. female past medical history of diabetes, hypertension, hyperlipidemia, lives independently, was last seen normal sometime Sunday-March 10, 2022 in the evening and then found by a friend down on the ground today and brought to the hospital for further evaluation.  Patient had speech difficulty-mostly word finding and some slurred speech on arrival.  Last known well was outside the window for intervention.  Patient was able to tell the ED provider that she had a fall from the couch 2 days ago but could not tell further much after that. ?Friend also reports that her medications come from the pharmacy in the blister pack and were unopened since at least Thursday of last week. ?Unclear compliance to medications and medical treatment at this time ?LKW: 03/10/2022 evening ?tpa given?: no, OSW ?Premorbid modified Rankin scale (mRS): 1 ?NIHSS 7 ? ? ?INTERVAL HISTORY ?The patient is seen in her room with no family at bedside. She is alert but disoriented. She is unable to relate any of the events that brought her into the hospital and she is unable to state the month or year. Abstract thought and short term memory are severely impaired. She denies having any family and states that she is independent for iADLs normally.  ?MRI scan of the brain shows a large acute left thalamic infarct and extending into paramedian midbrain.  CT angiogram of the brain and neck showed no significant large vessel stenosis or occlusion. ?Echocardiogram showed ejection fraction of 60 to 65%.  Mild calcification of aortic valve. ? ?OBJECTIVE ?Vitals:  ? 03/13/22 0430 03/13/22 0445 03/13/22 0500 03/13/22 0515  ?BP: (!) 155/78 (!) 154/111 (!) 157/95   ?Pulse: 75 75 86 73  ?Resp: 20  19 15   ?SpO2: 97% 97% 98% 98%  ?Weight:      ?Height:      ? ? ?CBC:  ?Recent Labs  ?Lab 03/12/22 ?2021 03/13/22 ?0432  ?WBC 8.3 7.6  ?NEUTROABS 5.8 4.3  ?HGB 13.7 13.1  ?HCT  40.7 39.2  ?MCV 89.6 90.5  ?PLT 215 216  ? ? ?Basic Metabolic Panel:  ?Recent Labs  ?Lab 03/12/22 ?2021 03/13/22 ?0432  ?NA 140  --   ?K 2.6*  --   ?CL 107  --   ?CO2 25  --   ?GLUCOSE 240*  --   ?BUN 9  --   ?CREATININE 1.19*  --   ?CALCIUM 8.3*  --   ?MG  --  1.5*  ?PHOS  --  1.8*  ? ? ?Lipid Panel:  ?   ?Component Value Date/Time  ? CHOL 126 03/13/2022 0432  ? TRIG 150 (H) 03/13/2022 0432  ? HDL 36 (L) 03/13/2022 0432  ? CHOLHDL 3.5 03/13/2022 0432  ? VLDL 30 03/13/2022 0432  ? [place] 60 03/13/2022 0432  ? ?HgbA1c: No results found for: HGBA1C ?Urine Drug Screen: No results found for: LABOPIA, COCAINSCRNUR, [place], [place], THCU, LABBARB  ?Alcohol Level No results found for: ETH ? ?IMAGING ? ? ?CT ANGIO HEAD NECK W WO CM ? ?Result Date: 03/13/2022 ?CLINICAL DATA:  Thalamic infarct EXAM: CT ANGIOGRAPHY HEAD AND NECK TECHNIQUE: Multidetector CT imaging of the head and neck was performed using the standard protocol during bolus administration of intravenous contrast. Multiplanar CT image reconstructions and MIPs were obtained to evaluate the vascular anatomy. Carotid stenosis measurements (when applicable) are obtained utilizing NASCET criteria, using the distal internal carotid diameter as the denominator. RADIATION  DOSE REDUCTION: This exam was performed according to the departmental dose-optimization program which includes automated exposure control, adjustment of the mA and/or kV according to patient size and/or use of iterative reconstruction technique. CONTRAST:  39mL OMNIPAQUE IOHEXOL 350 MG/ML SOLN COMPARISON:  None. FINDINGS: CTA NECK FINDINGS SKELETON: There is no bony spinal canal stenosis. No lytic or blastic lesion. OTHER NECK: Normal pharynx, larynx and major salivary glands. No cervical lymphadenopathy. Unremarkable thyroid gland. UPPER CHEST: No pneumothorax or pleural effusion. No nodules or masses. AORTIC ARCH: There is no calcific atherosclerosis of the aortic arch. There is no aneurysm,  dissection or hemodynamically significant stenosis of the visualized portion of the aorta. Conventional 3 vessel aortic branching pattern. The visualized proximal subclavian arteries are widely patent. RIGHT CAROTID SYSTEM: Normal without aneurysm, dissection or stenosis. LEFT CAROTID SYSTEM: Normal without aneurysm, dissection or stenosis. VERTEBRAL ARTERIES: Left dominant configuration. Both origins are clearly patent. There is no dissection, occlusion or flow-limiting stenosis to the skull base (V1-V3 segments). CTA HEAD FINDINGS POSTERIOR CIRCULATION: --Vertebral arteries: Normal V4 segments. --Inferior cerebellar arteries: Normal. --Basilar artery: Normal. --Superior cerebellar arteries: Normal. --Posterior cerebral arteries (PCA): Normal. ANTERIOR CIRCULATION: --Intracranial internal carotid arteries: Normal. --Anterior cerebral arteries (ACA): Normal. Both A1 segments are present. Patent anterior communicating artery (a-comm). --Middle cerebral arteries (MCA): Normal. VENOUS SINUSES: As permitted by contrast timing, patent. ANATOMIC VARIANTS: None Review of the MIP images confirms the above findings. IMPRESSION: Normal CTA of the head and neck. Electronically Signed   By: Ulyses Jarred M.D.   On: 03/13/2022 02:08  ? ?CT HEAD WO CONTRAST (5MM) ? ?Result Date: 03/12/2022 ?CLINICAL DATA:  Acute neurological deficit. Stroke suspected. Patient was found on the floor. EXAM: CT HEAD WITHOUT CONTRAST TECHNIQUE: Contiguous axial images were obtained from the base of the skull through the vertex without intravenous contrast. RADIATION DOSE REDUCTION: This exam was performed according to the departmental dose-optimization program which includes automated exposure control, adjustment of the mA and/or kV according to patient size and/or use of iterative reconstruction technique. COMPARISON:  07/11/2012 FINDINGS: Brain: Diffuse cerebral atrophy. Ventricular dilatation consistent with central atrophy. Low-attenuation changes  in the deep white matter consistent with small vessel ischemia. Asymmetric low-attenuation in the left thalamus suggesting acute or subacute infarct. Consider MRI for further characterization. Prominent CSF space in the posterior fossa likely representing prominent cisterna magna. No abnormal extra-axial fluid collections. No mass effect or midline shift. Gray-white matter junctions are distinct. Basal cisterns are not effaced. No acute intracranial hemorrhage. Vascular: Moderate intracranial arterial vascular calcifications. Skull: Calvarium appears intact. Sinuses/Orbits: Retention cyst or polyp in the right anterior nasal passage. Paranasal sinuses and mastoid air cells are otherwise clear. Other: None. IMPRESSION: 1. Asymmetrical low-attenuation in the left thalamus suggesting acute or subacute infarct. Consider MRI for further evaluation. 2. Chronic atrophy and small vessel ischemic changes diffusely. 3. No acute intracranial hemorrhage. Electronically Signed   By: Lucienne Capers M.D.   On: 03/12/2022 20:23  ? ?MR BRAIN WO CONTRAST ? ?Result Date: 03/12/2022 ?CLINICAL DATA:  Acute neurologic deficit EXAM: MRI HEAD WITHOUT CONTRAST TECHNIQUE: Multiplanar, multiecho pulse sequences of the brain and surrounding structures were obtained without intravenous contrast. COMPARISON:  None. FINDINGS: Brain: Small acute infarct of the left thalamus extending into the left paramedian midbrain. No acute or chronic hemorrhage. Hyperintense T2-weighted signal is moderately widespread throughout the white matter. Generalized volume loss without a clear lobar predilection. A partially empty sella is incidentally noted. Vascular: Major flow voids are preserved. Skull and upper  cervical spine: Normal calvarium and skull base. Visualized upper cervical spine and soft tissues are normal. Sinuses/Orbits:No paranasal sinus fluid levels or advanced mucosal thickening. No mastoid or middle ear effusion. Normal orbits. IMPRESSION: 1.  Small acute infarct of the left thalamus extending into the left paramedian midbrain. No hemorrhage or mass effect. 2. Moderate chronic small vessel ischemic disease and generalized volume loss. Electronically Si

## 2022-03-13 NOTE — [place] FL2 (Signed)
?[place] MEDICAID FL2 LEVEL OF CARE SCREENING TOOL  ?  ? ?IDENTIFICATION  ?Patient Name: ?Rachael Lane Birthdate: 1951-11-22 Sex: female Admission Date (Current Location): ?03/12/2022  ?[place] Number: ? [place] ?  Facility and Address:  ?[facility], [address] [place], [place] [address] ?     Provider Number: ?[id number]  ?Attending Physician Name and Address:  ?Therisa Doyne, MD ? Relative Name and Phone Number:  ?  ?   ?Current Level of Care: ?Hospital Recommended Level of Care: ?Skilled Nursing Facility Prior Approval Number: ?  ? ?Date Approved/Denied: ?  PASRR Number: ?[id number] ? ?Discharge Plan: ?SNF ?  ? ?Current Diagnoses: ?Patient Active Problem List  ? Diagnosis Date Noted  ? UTI (urinary tract infection) 03/13/2022  ? Dementia without behavioral disturbance (HCC) 03/13/2022  ? Hypomagnesemia 03/13/2022  ? Hypophosphatemia 03/13/2022  ? Depression   ? Stroke (cerebrum) (HCC) 03/12/2022  ? Essential hypertension 03/12/2022  ? Hyperlipidemia 03/12/2022  ? DM2 (diabetes mellitus, type 2) (HCC) 03/12/2022  ? Hypokalemia 03/12/2022  ? ? ?Orientation RESPIRATION BLADDER Height & Weight   ?  ?Self, Place ? Normal Incontinent, External catheter Weight: 194 lb 8 oz (88.2 kg) ?Height:  5\' 9"  (175.3 cm)  ?BEHAVIORAL SYMPTOMS/MOOD NEUROLOGICAL BOWEL NUTRITION STATUS  ?    Incontinent Diet (See DC summary)  ?AMBULATORY STATUS COMMUNICATION OF NEEDS Skin   ?Limited Assist Verbally Normal ?  ?  ?  ?    ?     ?     ? ? ?Personal Care Assistance Level of Assistance  ?Bathing, Feeding, Dressing Bathing Assistance: Limited assistance ?  ?Dressing Assistance: Limited assistance ?   ? ?Functional Limitations Info  ?Sight, Hearing, Speech Sight Info: Adequate ?Hearing Info: Adequate ?Speech Info: Adequate  ? ? ?SPECIAL CARE FACTORS FREQUENCY  ?PT (By licensed PT), OT (By licensed OT)   ?  ?PT Frequency: 5x week ?OT Frequency: 5x week ?  ?  ?  ?   ? ? ?Contractures  Contractures Info: Not present  ? ? ?Additional Factors Info  ?Code Status, Allergies, Insulin Sliding Scale Code Status Info: Full ?Allergies Info: NKA ?  ?Insulin Sliding Scale Info: Insulin Aspart (Novolog) 0-9 U every 4 hours ?  ?   ? ?Current Medications (03/13/2022):  This is the current hospital active medication list ?Current Facility-Administered Medications  ?Medication Dose Route Frequency Provider Last Rate Last Admin  ? 0.9 %  sodium chloride infusion   Intravenous Continuous 05/13/2022, MD   Stopped at 03/13/22 1310  ? acetaminophen (TYLENOL) tablet 650 mg  650 mg Oral Q4H PRN 05/13/22, MD      ? Or  ? acetaminophen (TYLENOL) 160 MG/5ML solution 650 mg  650 mg Per Tube Q4H PRN Therisa Doyne, MD      ? Or  ? acetaminophen (TYLENOL) suppository 650 mg  650 mg Rectal Q4H PRN Doutova, Anastassia, MD      ? aspirin suppository 300 mg  300 mg Rectal Daily Doutova, Anastassia, MD      ? Or  ? aspirin tablet 325 mg  325 mg Oral Daily Doutova, Anastassia, MD   325 mg at 03/13/22 1119  ? atorvastatin (LIPITOR) tablet 80 mg  80 mg Oral Daily Doutova, 05/13/22, MD   80 mg at 03/13/22 1119  ? cefTRIAXone (ROCEPHIN) 2 g in sodium chloride 0.9 % 100 mL IVPB  2 g Intravenous QHS 05/13/22, MD   Stopped at 03/13/22 0238  ?  insulin aspart (novoLOG) injection 0-9 Units  0-9 Units Subcutaneous Q4H Therisa Doyne, MD   3 Units at 03/13/22 1124  ? potassium PHOSPHATE 20 mmol in dextrose 5 % 500 mL infusion  20 mmol Intravenous Once Marguerita Merles Latif, DO 84 mL/hr at 03/13/22 1124 20 mmol at 03/13/22 1124  ? ? ? ?Discharge Medications: ?Please see discharge summary for a list of discharge medications. ? ?Relevant Imaging Results: ? ?Relevant Lab Results: ? ? ?Additional Information ?SS# [phone] ? ?Carley Hammed, LCSWA ? ? ? ? ?

## 2022-03-14 ENCOUNTER — Other Ambulatory Visit: Payer: Self-pay | Admitting: Physician Assistant

## 2022-03-14 DIAGNOSIS — I10 Essential (primary) hypertension: Secondary | ICD-10-CM | POA: Diagnosis not present

## 2022-03-14 DIAGNOSIS — I639 Cerebral infarction, unspecified: Secondary | ICD-10-CM

## 2022-03-14 DIAGNOSIS — E119 Type 2 diabetes mellitus without complications: Secondary | ICD-10-CM | POA: Diagnosis not present

## 2022-03-14 DIAGNOSIS — F0390 Unspecified dementia without behavioral disturbance: Secondary | ICD-10-CM | POA: Diagnosis not present

## 2022-03-14 DIAGNOSIS — I63432 Cerebral infarction due to embolism of left posterior cerebral artery: Secondary | ICD-10-CM | POA: Diagnosis not present

## 2022-03-14 LAB — URINE CULTURE

## 2022-03-14 LAB — PHOSPHORUS
Phosphorus: 2.5 mg/dL (ref 2.5–4.6)
Phosphorus: 3.1 mg/dL (ref 2.5–4.6)

## 2022-03-14 LAB — CBC WITH DIFFERENTIAL/PLATELET
Abs Immature Granulocytes: 0.01 10*3/uL (ref 0.00–0.07)
Basophils Absolute: 0.1 10*3/uL (ref 0.0–0.1)
Basophils Relative: 1 %
Eosinophils Absolute: 0.2 10*3/uL (ref 0.0–0.5)
Eosinophils Relative: 3 %
HCT: 36.6 % (ref 36.0–46.0)
Hemoglobin: 12.4 g/dL (ref 12.0–15.0)
Immature Granulocytes: 0 %
Lymphocytes Relative: 42 %
Lymphs Abs: 2.4 10*3/uL (ref 0.7–4.0)
MCH: 30.1 pg (ref 26.0–34.0)
MCHC: 33.9 g/dL (ref 30.0–36.0)
MCV: 88.8 fL (ref 80.0–100.0)
Monocytes Absolute: 0.5 10*3/uL (ref 0.1–1.0)
Monocytes Relative: 10 %
Neutro Abs: 2.4 10*3/uL (ref 1.7–7.7)
Neutrophils Relative %: 44 %
Platelets: 202 10*3/uL (ref 150–400)
RBC: 4.12 MIL/uL (ref 3.87–5.11)
RDW: 13.2 % (ref 11.5–15.5)
WBC: 5.6 10*3/uL (ref 4.0–10.5)
nRBC: 0 % (ref 0.0–0.2)

## 2022-03-14 LAB — GLUCOSE, CAPILLARY
Glucose-Capillary: 129 mg/dL — ABNORMAL HIGH (ref 70–99)
Glucose-Capillary: 138 mg/dL — ABNORMAL HIGH (ref 70–99)
Glucose-Capillary: 150 mg/dL — ABNORMAL HIGH (ref 70–99)
Glucose-Capillary: 171 mg/dL — ABNORMAL HIGH (ref 70–99)
Glucose-Capillary: 240 mg/dL — ABNORMAL HIGH (ref 70–99)
Glucose-Capillary: 295 mg/dL — ABNORMAL HIGH (ref 70–99)

## 2022-03-14 LAB — COMPREHENSIVE METABOLIC PANEL
ALT: 17 U/L (ref 0–44)
ALT: 16 U/L (ref 0–44)
AST: 16 U/L (ref 15–41)
AST: 16 U/L (ref 15–41)
Albumin: 2.9 g/dL — ABNORMAL LOW (ref 3.5–5.0)
Albumin: 3 g/dL — ABNORMAL LOW (ref 3.5–5.0)
Alkaline Phosphatase: 147 U/L — ABNORMAL HIGH (ref 38–126)
Alkaline Phosphatase: 148 U/L — ABNORMAL HIGH (ref 38–126)
Anion gap: 7 (ref 5–15)
Anion gap: 9 (ref 5–15)
BUN: <5 mg/dL — ABNORMAL LOW (ref 8–23)
BUN: <5 mg/dL — ABNORMAL LOW (ref 8–23)
CO2: 24 mmol/L (ref 22–32)
CO2: 22 mmol/L (ref 22–32)
Calcium: 8.1 mg/dL — ABNORMAL LOW (ref 8.9–10.3)
Calcium: 8 mg/dL — ABNORMAL LOW (ref 8.9–10.3)
Chloride: 109 mmol/L (ref 98–111)
Chloride: 108 mmol/L (ref 98–111)
Creatinine, Ser: 1.08 mg/dL — ABNORMAL HIGH (ref 0.44–1.00)
Creatinine, Ser: 1.01 mg/dL — ABNORMAL HIGH (ref 0.44–1.00)
GFR, Estimated: 55 mL/min — ABNORMAL LOW (ref >60)
GFR, Estimated: 60 mL/min — ABNORMAL LOW (ref >60)
Glucose, Bld: 144 mg/dL — ABNORMAL HIGH (ref 70–99)
Glucose, Bld: 264 mg/dL — ABNORMAL HIGH (ref 70–99)
Potassium: 2.5 mmol/L — CL (ref 3.5–5.1)
Potassium: 3.3 mmol/L — ABNORMAL LOW (ref 3.5–5.1)
Sodium: 140 mmol/L (ref 135–145)
Sodium: 139 mmol/L (ref 135–145)
Total Bilirubin: 0.3 mg/dL (ref 0.3–1.2)
Total Bilirubin: 1 mg/dL (ref 0.3–1.2)
Total Protein: 5.2 g/dL — ABNORMAL LOW (ref 6.5–8.1)
Total Protein: 5.6 g/dL — ABNORMAL LOW (ref 6.5–8.1)

## 2022-03-14 LAB — MAGNESIUM
Magnesium: 1.4 mg/dL — ABNORMAL LOW (ref 1.7–2.4)
Magnesium: 1.9 mg/dL (ref 1.7–2.4)

## 2022-03-14 MED ORDER — POTASSIUM CHLORIDE CRYS ER 20 MEQ PO TBCR
40.0000 meq | EXTENDED_RELEASE_TABLET | Freq: Once | ORAL | Status: AC
  Administered 2022-03-14: 40 meq via ORAL
  Filled 2022-03-14: qty 2

## 2022-03-14 MED ORDER — ASPIRIN 81 MG PO TBEC: 81.0000 mg | DELAYED_RELEASE_TABLET | Freq: Every day | ORAL | 11 refills | Status: AC

## 2022-03-14 MED ORDER — INSULIN GLARGINE-YFGN 100 UNIT/ML ~~LOC~~ SOLN: 15.0000 [IU] | Freq: Every day | SUBCUTANEOUS | 11 refills | Status: DC

## 2022-03-14 MED ORDER — POTASSIUM CHLORIDE CRYS ER 20 MEQ PO TBCR
40.0000 meq | EXTENDED_RELEASE_TABLET | ORAL | Status: AC
  Administered 2022-03-14 (×3): 40 meq via ORAL
  Filled 2022-03-14 (×3): qty 2

## 2022-03-14 MED ORDER — ATORVASTATIN CALCIUM 80 MG PO TABS
80.0000 mg | ORAL_TABLET | Freq: Every day | ORAL | 0 refills | Status: AC
Start: 2022-03-15 — End: ?

## 2022-03-14 MED ORDER — CLOPIDOGREL BISULFATE 75 MG PO TABS: 75.0000 mg | ORAL_TABLET | Freq: Every day | ORAL | 0 refills | Status: DC

## 2022-03-14 MED ORDER — INSULIN GLARGINE-YFGN 100 UNIT/ML ~~LOC~~ SOLN
15.0000 [IU] | Freq: Every day | SUBCUTANEOUS | Status: DC
  Administered 2022-03-14 – 2022-03-15 (×2): 15 [IU] via SUBCUTANEOUS
  Filled 2022-03-14 (×2): qty 0.15

## 2022-03-14 MED ORDER — MAGNESIUM SULFATE 2 GM/50ML IV SOLN
2.0000 g | Freq: Once | INTRAVENOUS | Status: AC
  Administered 2022-03-14: 2 g via INTRAVENOUS
  Filled 2022-03-14: qty 50

## 2022-03-14 MED ORDER — SODIUM CHLORIDE 0.9 % IV SOLN
2.0000 g | Freq: Every day | INTRAVENOUS | Status: DC
  Administered 2022-03-14: 2 g via INTRAVENOUS
  Filled 2022-03-14: qty 20

## 2022-03-14 NOTE — Progress Notes (Addendum)
Speech Language Pathology Treatment: Dysphagia  ?Patient Details ?Name: Rachael Lane ?MRN: [id number] ?DOB: 1951-06-18 ?Today's Date: 03/14/2022 ?Time: 9892-1194 ?SLP Time Calculation (min) (ACUTE ONLY): 18 min ? ?Assessment / Plan / Recommendation ?Clinical Impression ? Pt was seen for dysphagia treatment. She was alert and cooperative during the session. She stated that she has been tolerating the current diet without difficulty, but that she would now like her diet to be advanced to regular. Pt's oropharyngeal swallow mechanism was functional for thin liquids, dysphagia 3 and regular texture textures. No s/sx of aspiration were noted and mastication and oral clearance were adequate. Her diet will be advanced to regular texture solids and thin liquids. Pt was able to recall some events from during the days. She continues to be disorientated to time, but she stated that she typically does not attend to these things and cannot read. Pt was re-oriented, but exhibited difficulty with recall of this information. Pt demonstrated 67% accuracy with problem solving related to safety given verbal prompts. SLP will continue to follow pt.   ?  ?HPI HPI: Pt is a 71 y.o. female who presented with confusion. Pt reportedly fell off her cough two days prior to admission and was unable to get up thereafter. Difficulted with word retrieval noted by EMS. MRI brain 4/4: Small acute infarct of the left thalamus extending into the left paramedian midbrain. PMH: Obesity diabetes type 2, depression, HLD, HTN, mild dementia. ?  ?   ?SLP Plan ? Continue with current plan of care ? ?  ?  ?Recommendations for follow up therapy are one component of a multi-disciplinary discharge planning process, led by the attending physician.  Recommendations may be updated based on patient status, additional functional criteria and insurance authorization. ?  ? ?Recommendations  ?Diet recommendations: Regular;Thin liquid ?Liquids provided via:  Cup;Straw ?Medication Administration: Whole meds with liquid ?Supervision: Patient able to self feed  ?   ?    ?   ? ? ? ? Oral Care Recommendations: Oral care BID ?Follow Up Recommendations: Skilled nursing-short term rehab (<3 hours/day) ?Assistance recommended at discharge: Frequent or constant Supervision/Assistance ?SLP Visit Diagnosis: Dysphagia, unspecified (R13.10) ?Plan: Continue with current plan of care ? ? ? ? ?  ?  ? ?Driana Dazey I. Vear Clock, MS, CCC-SLP ?Acute Rehabilitation Services ?Office number [phone] ?Pager [phone] ? ?Scheryl Marten ? ?03/14/2022, 10:40 AM ? ? ?

## 2022-03-14 NOTE — Progress Notes (Signed)
[place]  

## 2022-03-14 NOTE — TOC Progression Note (Addendum)
Transition of Care (TOC) - Progression Note  ? ? ?Patient Details  ?Name: Rachael Lane ?MRN: [id number] ?Date of Birth: Feb 12, 1951 ? ?Transition of Care (TOC) CM/SW Contact  ?Carley Hammed, LCSWA ?Phone Number: ?03/14/2022, 11:54 AM ? ?Clinical Narrative:    ?[employer] approval is good for 7 days with a 5 day window to transfer. ?SNF [phone] ?Amb [id number] ? ?CSW spoke with [facility] liaison, Kennith Center. She noted that they have a bed for pt when medically ready. There is a preference for DC today as she will not be in the office tomorrow, though if DC doesn't happen today she will work on getting coverage for an admission tomorrow. CSW followed up with POA who is agreeable to [facility] whenever pt is ready. CSW started [place] and updated MD who noted it is possible pt could be ready today. TOC will continue to follow for DC needs. ? ? ?Expected Discharge Plan: Skilled Nursing Facility ?Barriers to Discharge: SNF Pending bed offer, Insurance Authorization, Family Issues, Continued Medical Work up ? ?Expected Discharge Plan and Services ?Expected Discharge Plan: Skilled Nursing Facility ?  ?  ?Post Acute Care Choice: Skilled Nursing Facility ?Living arrangements for the past 2 months: Single Family Home ?                ?  ?  ?  ?  ?  ?  ?  ?  ?  ?  ? ? ?Social Determinants of Health (SDOH) Interventions ?  ? ?Readmission Risk Interventions ?   ? View : No data to display.  ?  ?  ?  ? ? ?

## 2022-03-14 NOTE — Discharge Summary (Signed)
Physician Discharge Summary  ?Rachael Lane [place] TMA:[id number] DOB: 07-16-1951 DOA: 03/12/2022 ? ?PCP: Simone Curia, MD ? ?Admit date: 03/12/2022 ?Discharge date: 03/14/2022 ? ?Admitted From: Home ?Disposition:  SNF ? ?Recommendations for Outpatient Follow-up:  ?Follow up with PCP in 1-2 weeks ?Follow up with Neurology in 4-6 weeks ?Continue 30-Day Monitor for D/C  ?Please obtain CMP/CBC, Mag, Phos in one week ?Please follow up on the following pending results: ? ?Home Health: No  ?Equipment/Devices: None but will need a 30 day Holter   ? ?Discharge Condition: Stable  ?CODE STATUS: FULL CODE   ?Diet recommendation:  ? ?Brief/Interim Summary: ?The patient is an overweight 71 year old Caucasian female with a past medical history significant for below 2 diabetes mellitus type 2, depression, hyperlipidemia, hypertension, history dementia without with behavioral disturbances, as well as other comorbidities who was transferred from [facility] after she was found on the floor by her friend with an unknown downtime and had difficulty with speech.  She was last seen normal on Sunday, April second 2023 and was found by her friend brought into the ED for further evaluation.  She had speech difficulty with mostly word finding and some slurred speech and was outside the window for intervention.  She was able to tell the ED provider that she fell from a couch 2 days ago but cannot tell much of the information about that and then did tell us that her friend found her.  The friend reported that she has medications from her pharmacy in blister packs and they were unaware but since Thursday last week.  It is unclear if she is compliant to medications and medical treatment but further work-up was done while she was in the ED and the head CT was concerning for left thalamic subacute infarct and MRI of the brain was done and showed an acute to subacute left thalamic infarct extending into the midbrain of the left.  Neurology was consulted and  recommended further work-up and she is currently undergoing further investigation of her CVA and currently awaiting PT, OT, SLP evaluations as well as echocardiogram.  Given that she lives alone and has cognitive deficits now secondary to her CVA she likely needs SNF placement.   ? ?She completed her stroke workup and Neurology recommends DAPT for 3 weeks and then just ASA alone. She will need to follow up with PCP and Neurology as an outpatient. She will need a 30-Day Holter Monitor at D/C.   ? ?Discharge Diagnoses:  ?Principal Problem: ?  Stroke (cerebrum) (HCC) ?Active Problems: ?  Essential hypertension ?  Hyperlipidemia ?  DM2 (diabetes mellitus, type 2) (HCC) ?  Hypokalemia ?  UTI (urinary tract infection) ?  Dementia without behavioral disturbance (HCC) ?  Hypomagnesemia ?  Hypophosphatemia ?  Depression ? ?Stroke (cerebrum) (HCC) ?-Admitted to Hospitalist Service and admitted based on the TIA/CVA Protocol ?-C/w Telemetry monitoring ?-Stroke Head CT done and showed "Asymmetrical low-attenuation in the left thalamus suggesting acute or subacute infarct. Consider MRI for further evaluation. Chronic atrophy and small vessel ischemic changes diffusely. No acute intracranial hemorrhage" ?-MRI Brain w/o Contrast showed "Small acute infarct of the left thalamus extending into the left paramedian midbrain. No hemorrhage or mass effect. Moderate chronic small vessel ischemic disease and generalized volume loss" ?-CTA Angio Head and Neck w/wo Contrast done and showed "Normal CTA of the head and neck" ?-Per Neuro allow permissive hypertension for the first 24 to 48 hours and only treat as needed if SBP is greater than 220  with blood pressures being normalized to less than 140 upon discharge ?-Echocardiogram done and showed EF of 60-65% with normal LV Fxn and had no regional wall motion abnormalities and Left Ventricular Diastolic Parameters with G1DDD ?-Continue with neurochecks per protocol ?-Patient was initiated on  aspirin 325 mg and is now on ASA 81 mg po Daily and Clopidogrel 75 mg po Daily and they are recommending DAPT for 3 weeks and then just ASA alone  ?-Initiated on high intensity Atorvastatin 80 mg p.o. daily ?-Continue Neurochecks per protocol ?-Checking lipid panel and hemoglobin A1c ?-Lipid panel done and showed a total cholesterol/HDL level of 3.5, cholesterol 126, HDL 36, LDL 60, triglycerides 150, VLDL of 30 ?-Risk factor modification ?-Discussed the case with Neurology and the CVA has affected her vertical gaze and memory/cognition and since she lives alone will need placement  ?-PT consult, OT consult, Speech consult and pending but likely will need SNF given that she lives alone ?-If Afib found on telemetry, will need anticoagulation but will defer decision pending imaging and stroke team evaluation; Neurology recommending 30-Day Holter for D/C as they are likely going to defer Loop Recorder implantation given her cognitive deficits  ?  ?Depression ?-Once Safe to swallow resume Citalopram 40 mg po qHS and Doxepin 25 mg po qHS ?  ?Hypophosphatemia ?-Phos Level low at 3.1 ?-Continue to Monitor and Replete as Necessary ?-Repeat Phos Level in the AM ?  ?Hypomagnesemia ?-Mag Level was 1.5 ?-Replete with IV Mag Sulfate 2 grams and improved to 1.9 ?-Continue to Monitor and Replete as Necessary ?-Repeat Mag Level in the AM ?  ?Dementia without behavioral disturbance (HCC) ?-Continue with prior to admit donepezil 10 mg p.o. daily and doxepin 25 mg p.o. nightly ?  ?UTI (urinary tract infection) ?-Urinalysis done that showed a clear appearance with greater than 500 glucose, 20 ketones, small leukocytes, negative nitrites, few bacteria, 0-5 squamous epithelial cells, 0-5 RBCs per high-power field and 21-50 WBCs ?-Urine culture showed Multiple Species  ?-Initiated on empiric antibiotics with IV ceftriaxone we will continue for now but since Afebrile and has no Leukocytosis will D/C Abx after today's dose (Day  3/3) ?-Patient is currently afebrile has no leukocytosis as WBC is 7.6 ?  ?Hypokalemia ?-K+ was 2.5 this AM and replete and improved to  ?-Replete with IV K Phos 20 mmol and IV KCl 40 mEQ ?-Continue to Monitor and Replete as Necessary ?-Repeat CMP in the AM  ?  ?Uncontrolled DM2 (diabetes mellitus, type 2) with Hyperglycemia (HCC) ?-Hemoglobin A1c is 11.0 ?-Continue to monitor CBGs and CBGs ranging from 171-248 ?-Initiated on sensitive NovoLog sign scale insulin every 4 while hospitalized ?-C/w Trajenta and Rybelsus and will add Lantus 15 units sq Daily  ?-Now cleared for a Dysphagia 3 Diet  ?-Follow up with PCP as an outpatient for further Blood Sugar Management  ?  ?Hyperlipidemia ?-Lipid panel as above ?-Initiated on high intensity atorvastatin 80 mg p.o. daily ?  ?Essential hypertension ?-Allowing for permissive hypertension recurrent ?-Allow for normalization upon discharge/systolic blood pressure less than 140 ?-Continue monitor blood pressures per protocol ?-Last blood pressure reading was 140/95 ?  ?Discharge Instructions ? ?Discharge Instructions   ? ? Call MD for:  difficulty breathing, headache or visual disturbances   Complete by: As directed ?  ? Call MD for:  extreme fatigue   Complete by: As directed ?  ? Call MD for:  hives   Complete by: As directed ?  ? Call MD for:  persistant dizziness  or light-headedness   Complete by: As directed ?  ? Call MD for:  persistant nausea and vomiting   Complete by: As directed ?  ? Call MD for:  redness, tenderness, or signs of infection (pain, swelling, redness, odor or green/yellow discharge around incision site)   Complete by: As directed ?  ? Call MD for:  severe uncontrolled pain   Complete by: As directed ?  ? Call MD for:  temperature >100.4   Complete by: As directed ?  ? Diet - low sodium heart healthy   Complete by: As directed ?  ? Diet Carb Modified   Complete by: As directed ?  ? Discharge instructions   Complete by: As directed ?  ? You were cared for  by a hospitalist during your hospital stay. If you have any questions about your discharge medications or the care you received while you were in the hospital after you are discharged, you can call the unit and ask to

## 2022-03-14 NOTE — Evaluation (Signed)
Occupational Therapy Evaluation ?Patient Details ?Name: Rachael Lane ?MRN: [id number] ?DOB: 24-Apr-1951 ?Today's Date: 03/14/2022 ? ? ?History of Present Illness The pt is a 71 yo female presenting 4/4 from home after being found down on the floor for an unknown period of time by a friend. 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 PMH includes: arthritis, depression, DM II, HLD, and HTN.  ? ?Clinical Impression ?  ?Pt admitted for concerns listed above.PTA pt reported that she was independent with all ADL's and IADL's, including driving and caring for her dog. At this time, pt is limited by double vision in her L eye, weakness, balance deficits, and decreased activity tolerance. She is requiring min-mod A with all ADL's and functional mobility, using a RW. Pt aware of her deficits at this time and very worried about returning to her independence. OT recommending SNF level therapies. OT will follow acutely.   ?   ? ?Recommendations for follow up therapy are one component of a multi-disciplinary discharge planning process, led by the attending physician.  Recommendations may be updated based on patient status, additional functional criteria and insurance authorization.  ? ?Follow Up Recommendations ? Skilled nursing-short term rehab (<3 hours/day)  ?  ?Assistance Recommended at Discharge Frequent or constant Supervision/Assistance  ?Patient can return home with the following A lot of help with walking and/or transfers;A lot of help with bathing/dressing/bathroom;Assistance with cooking/housework;Direct supervision/assist for financial management;Direct supervision/assist for medications management;Assist for transportation;Help with stairs or ramp for entrance ? ?  ?Functional Status Assessment ? Patient has had a recent decline in their functional status and demonstrates the ability to make significant improvements in function in a reasonable and predictable amount of time.  ?Equipment Recommendations ? None  recommended by OT  ?  ?Recommendations for Other Services   ? ? ?  ?Precautions / Restrictions Precautions ?Precautions: Fall ?Precaution Comments: pt reports double vision ?Restrictions ?Weight Bearing Restrictions: No  ? ?  ? ?Mobility Bed Mobility ?Overal bed mobility: Needs Assistance ?Bed Mobility: Supine to Sit, Sit to Supine ?  ?  ?Supine to sit: Min guard ?Sit to supine: Min assist ?  ?General bed mobility comments: minG with increased time to come to sitting, minA to reposition in bed ?  ? ?Transfers ?Overall transfer level: Needs assistance ?Equipment used: Rolling walker (2 wheels) ?Transfers: Sit to/from Stand ?Sit to Stand: Min assist ?  ?  ?  ?  ?  ?General transfer comment: Min A to power up and steady, with ambulation pt is min A due to double vision affecting balance when objects are around her or she has to step over thresholds ?  ? ?  ?Balance Overall balance assessment: Needs assistance ?Sitting-balance support: No upper extremity supported, Feet supported ?Sitting balance-Leahy Scale: Good ?  ?  ?Standing balance support: Bilateral upper extremity supported, During functional activity ?Standing balance-Leahy Scale: Poor ?  ?  ?  ?  ?  ?  ?  ?  ?  ?  ?  ?  ?   ? ?ADL either performed or assessed with clinical judgement  ? ?ADL Overall ADL's : Needs assistance/impaired ?Eating/Feeding: Set up;Sitting ?  ?Grooming: Set up;Sitting ?  ?Upper Body Bathing: Minimal assistance;Sitting ?  ?Lower Body Bathing: Moderate assistance;Sitting/lateral leans ?  ?Upper Body Dressing : Minimal assistance;Sitting ?  ?Lower Body Dressing: Moderate assistance;Sitting/lateral leans ?  ?Toilet Transfer: Minimal assistance;Ambulation ?  ?Toileting- Clothing Manipulation and Hygiene: Minimal assistance;Sitting/lateral lean ?  ?  ?  ?  Functional mobility during ADLs: Minimal assistance;Rolling walker (2 wheels) ?General ADL Comments: Pt limited due to balance concerns, weakness, and visual deficits.  ? ? ? ?Vision Baseline  Vision/History: 1 Wears glasses ?Ability to See in Adequate Light: 0 Adequate ?Patient Visual Report: Diplopia ?Vision Assessment?: Yes ?Eye Alignment: Impaired (comment) (L eye is raised, per pt and friend that is not her baseline) ?Ocular Range of Motion: Restricted on the left ?Alignment/Gaze Preference: Within Defined Limits ?Tracking/Visual Pursuits: Requires cues, head turns, or add eye shifts to track ?Saccades: Decreased speed of saccadic movement ?Visual Fields: Left visual field deficit ?Diplopia Assessment: Present all the time/all directions (in L eye only) ?Additional Comments: R eye is WFL, L eye is limited with tracking, there is a visual field cut, and with both eyes open and R eye occluded pt see's double  ?   ?Perception   ?  ?Praxis   ?  ? ?Pertinent Vitals/Pain Pain Assessment ?Pain Assessment: No/denies pain  ? ? ? ?Hand Dominance Right ?  ?Extremity/Trunk Assessment Upper Extremity Assessment ?Upper Extremity Assessment: Overall WFL for tasks assessed (No notable difference between LUE vs RUE) ?  ?Lower Extremity Assessment ?Lower Extremity Assessment: Defer to PT evaluation ?  ?Cervical / Trunk Assessment ?Cervical / Trunk Assessment: Kyphotic ?  ?Communication Communication ?Communication: No difficulties ?  ?Cognition Arousal/Alertness: Awake/alert ?Behavior During Therapy: [facility] for tasks assessed/performed ?Overall Cognitive Status: Within Functional Limits for tasks assessed ?  ?  ?  ?  ?  ?  ?  ?  ?  ?  ?  ?  ?  ?  ?  ?  ?General Comments: Pt has a learning disability/low education at baseline. She does not know how to read and needs things explained in simplified terms. ?  ?  ?General Comments  VSS on RA ? ?  ?Exercises   ?  ?Shoulder Instructions    ? ? ?Home Living Family/patient expects to be discharged to:: Private residence ?Living Arrangements: Alone ?Available Help at Discharge: Available PRN/intermittently ?Type of Home: House ?Home Access: Level entry ?  ?  ?Home Layout: One  level ?  ?  ?Bathroom Shower/Tub: Walk-in shower ?  ?Bathroom Toilet: Standard ?  ?  ?Home Equipment: Cane - quad ?  ?  ?  ? ?  ?Prior Functioning/Environment Prior Level of Function : Independent/Modified Independent;Driving ?  ?  ?  ?  ?  ?  ?Mobility Comments: pt reports typically no DME, no falls in last 6 months ?ADLs Comments: pt reports independence ?  ? ?  ?  ?OT Problem List: Decreased strength;Decreased activity tolerance;Impaired balance (sitting and/or standing);Impaired vision/perception ?  ?   ?OT Treatment/Interventions: Self-care/ADL training;Therapeutic exercise;Energy conservation;DME and/or AE instruction;Therapeutic activities;Visual/perceptual remediation/compensation;Patient/family education;Balance training  ?  ?OT Goals(Current goals can be found in the care plan section) Acute Rehab OT Goals ?Patient Stated Goal: To get her independence back ?OT Goal Formulation: With patient ?Time For Goal Achievement: 03/28/22 ?Potential to Achieve Goals: Good  ?OT Frequency: Min 2X/week ?  ? ?Co-evaluation   ?  ?  ?  ?  ? ?  ?AM-PAC OT "6 Clicks" Daily Activity     ?Outcome Measure Help from another person eating meals?: A Little ?Help from another person taking care of personal grooming?: A Little ?Help from another person toileting, which includes using toliet, bedpan, or urinal?: A Lot ?Help from another person bathing (including washing, rinsing, drying)?: A Lot ?Help from another person to put on and taking off regular  upper body clothing?: A Little ?Help from another person to put on and taking off regular lower body clothing?: A Lot ?6 Click Score: 15 ?  ?End of Session Equipment Utilized During Treatment: Gait belt;Rolling walker (2 wheels) ?Nurse Communication: Mobility status ? ?Activity Tolerance: Patient tolerated treatment well ?Patient left: in bed;with call bell/phone within reach;with bed alarm set ? ?OT Visit Diagnosis: Unsteadiness on feet (R26.81);Other abnormalities of gait and  mobility (R26.89);Muscle weakness (generalized) (M62.81);Low vision, both eyes (H54.2)  ?              ?Time: [id number] ?OT Time Calculation (min): 24 min ?Charges:  OT General Charges ?$OT Visit: 1 Visit ?OT E

## 2022-03-14 NOTE — Progress Notes (Signed)
Physical Therapy Treatment ?Patient Details ?Name: Rachael Lane ?MRN: [id number] ?DOB: [birth year] ?Today's Date: 03/14/2022 ? ? ?History of Present Illness 71 yo female presenting 4/4 from home after being found down on the floor for an unknown period of time by a friend. 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 PMH includes: arthritis, depression, DM II, HLD, and HTN. ? ?  ?PT Comments  ? ? Pt was seen for mobility to get to side of bed and balance, then to walk in the room.  Pt is fatigued from being OOB in chair earlier, but did agree to work.  Her plan is to to to SNF today, to complete her rehab toward getting home.  Follow as stay permits for goals of acute PT.  Monitor her for safety awareness, work on LE strength and endurance.   ?Recommendations for follow up therapy are one component of a multi-disciplinary discharge planning process, led by the attending physician.  Recommendations may be updated based on patient status, additional functional criteria and insurance authorization. ? ?Follow Up Recommendations ? Skilled nursing-short term rehab (<3 hours/day) ?  ?  ?Assistance Recommended at Discharge Frequent or constant Supervision/Assistance  ?Patient can return home with the following A lot of help with bathing/dressing/bathroom;Assistance with cooking/housework;Direct supervision/assist for medications management;Direct supervision/assist for financial management;Assist for transportation;Help with stairs or ramp for entrance;A lot of help with walking and/or transfers ?  ?Equipment Recommendations ? Rolling walker (2 wheels)  ?  ?Recommendations for Other Services   ? ? ?  ?Precautions / Restrictions Precautions ?Precautions: Fall ?Precaution Comments: pt states diplopia is intermittent ?Restrictions ?Weight Bearing Restrictions: No  ?  ? ?Mobility ? Bed Mobility ?Overal bed mobility: Needs Assistance ?Bed Mobility: Supine to Sit, Sit to Supine ?  ?  ?Supine to sit: Min assist ?Sit to  supine: Min assist ?  ?General bed mobility comments: min assist with fatigue per pt from being OOB ?  ? ?Transfers ?Overall transfer level: Needs assistance ?Equipment used: Rolling walker (2 wheels) ?Transfers: Sit to/from Stand ?Sit to Stand: Min assist ?  ?  ?  ?  ?  ?  ?  ? ?Ambulation/Gait ?Ambulation/Gait assistance: Min assist ?Gait Distance (Feet): 30 Feet ?Assistive device: Rolling walker (2 wheels), 1 person hand held assist ?Gait Pattern/deviations: Step-through pattern, Step-to pattern, Decreased stride length ?Gait velocity: reduced ?Gait velocity interpretation: <1.31 ft/sec, indicative of household ambulator ?Pre-gait activities: standing balance ck ?General Gait Details: Pt was able to maneuver on RW with help to get to the door, tends to fatigue without being able to make self determination of her limits ? ? ?Stairs ?  ?  ?  ?  ?  ? ? ?Wheelchair Mobility ?  ? ?Modified Rankin (Stroke Patients Only) ?  ? ? ?  ?Balance Overall balance assessment: Needs assistance ?Sitting-balance support: Feet supported ?Sitting balance-Leahy Scale: Good ?  ?  ?Standing balance support: Bilateral upper extremity supported, During functional activity ?Standing balance-Leahy Scale: Poor ?  ?  ?  ?  ?  ?  ?  ?  ?  ?  ?  ?  ?  ? ?  ?Cognition Arousal/Alertness: Awake/alert ?Behavior During Therapy: [facility] for tasks assessed/performed ?Overall Cognitive Status: Within Functional Limits for tasks assessed ?  ?  ?  ?  ?  ?  ?  ?  ?  ?  ?  ?  ?  ?  ?  ?  ?General Comments: low education ?  ?  ? ?  ?  Exercises   ? ?  ?General Comments General comments (skin integrity, edema, etc.): pt is getting up to walk with RW and note her fatigue with short walk. ?  ?  ? ?Pertinent Vitals/Pain Pain Assessment ?Pain Assessment: No/denies pain  ? ? ?Home Living Family/patient expects to be discharged to:: Private residence ?Living Arrangements: Alone ?Available Help at Discharge: Available PRN/intermittently ?Type of Home: House ?Home Access:  Level entry ?  ?  ?  ?Home Layout: One level ?Home Equipment: Cane - quad ?   ?  ?Prior Function    ?  ?  ?   ? ?PT Goals (current goals can now be found in the care plan section) Acute Rehab PT Goals ?Patient Stated Goal: to feel better ? ?  ?Frequency ? ? ? Min 3X/week ? ? ? ?  ?PT Plan Current plan remains appropriate  ? ? ?Co-evaluation   ?  ?  ?  ?  ? ?  ?AM-PAC PT "6 Clicks" Mobility   ?Outcome Measure ? Help needed turning from your back to your side while in a flat bed without using bedrails?: A Little ?Help needed moving from lying on your back to sitting on the side of a flat bed without using bedrails?: A Little ?Help needed moving to and from a bed to a chair (including a wheelchair)?: A Little ?Help needed standing up from a chair using your arms (e.g., wheelchair or bedside chair)?: A Little ?Help needed to walk in hospital room?: A Lot ?Help needed climbing 3-5 steps with a railing? : Total ?6 Click Score: 15 ? ?  ?End of Session Equipment Utilized During Treatment: Gait belt ?Activity Tolerance: Patient limited by fatigue;Treatment limited secondary to medical complications (Comment) ?Patient left: in bed;with call bell/phone within reach ?Nurse Communication: Mobility status ?PT Visit Diagnosis: Other abnormalities of gait and mobility (R26.89);Muscle weakness (generalized) (M62.81);History of falling (Z91.81) ?  ? ? ?Time: [id number] ?PT Time Calculation (min) (ACUTE ONLY): 33 min ? ?Charges:  $Gait Training: 8-22 mins ?$Therapeutic Activity: 8-22 mins          ?Ivar Drape ?03/14/2022, 8:05 PM ? ?Samul Dada, PT PhD ?Acute Rehab Dept. Number: [facility] [phone] and [facility] [phone] ? ? ?

## 2022-03-14 NOTE — Progress Notes (Addendum)
Patient discharging to [facility] in [place] via [place].  Attempted to call report to receiving nurse but they are currently unavailable. Name and number left for nurse to call and get report when available. Awaiting return call.  ? ?Receiving nurse at facility, [facility], called and report given. Now awaiting PTAR for transport.  ?

## 2022-03-14 NOTE — Progress Notes (Signed)
STROKE TEAM PROGRESS NOTE  ? ?  ? ? ?INTERVAL HISTORY ?The patient is sitting in the bedside chair.  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  Therapist recommend skilled nursing facility. ?vital signs are stable.  Neurological exam is unchanged  ?OBJECTIVE ?Vitals:  ? 03/13/22 2311 03/14/22 0425 03/14/22 0742 03/14/22 1225  ?BP: 135/77 (!) 147/74 (!) 146/90 (!) 140/95  ?Pulse: 82 72 75 (!) 102  ?Resp: 16 18 19 20   ?Temp: 98.6 ?F (37 ?C) 98.6 ?F (37 ?C) 97.8 ?F (36.6 ?C) 98.4 ?F (36.9 ?C)  ?TempSrc: Oral Oral Oral Oral  ?SpO2:  95% 95% 96%  ?Weight:      ?Height:      ? ? ?CBC:  ?Recent Labs  ?Lab 03/13/22 ?0432 03/14/22 ?05/14/22  ?WBC 7.6 5.6  ?NEUTROABS 4.3 2.4  ?HGB 13.1 12.4  ?HCT 39.2 36.6  ?MCV 90.5 88.8  ?PLT 216 202  ? ? ?Basic Metabolic Panel:  ?Recent Labs  ?Lab 03/14/22 ?05/14/22 03/14/22 ?1021  ?NA 140 139  ?K 2.5* 3.3*  ?CL 109 108  ?CO2 24 22  ?GLUCOSE 144* 264*  ?BUN <5* <5*  ?CREATININE 1.08* 1.01*  ?CALCIUM 8.1* 8.0*  ?MG 1.4* 1.9  ?PHOS 2.5 3.1  ? ? ?Lipid Panel:  ?   ?Component Value Date/Time  ? CHOL 126 03/13/2022 0432  ? TRIG 150 (H) 03/13/2022 0432  ? HDL 36 (L) 03/13/2022 0432  ? CHOLHDL 3.5 03/13/2022 0432  ? VLDL 30 03/13/2022 0432  ? LDLCALC 60 03/13/2022 0432  ? ?HgbA1c:  ?Lab Results  ?Component Value Date  ? HGBA1C 11.0 (H) 03/13/2022  ? ?Urine Drug Screen: No results found for: LABOPIA, COCAINSCRNUR, LABBENZ, AMPHETMU, THCU, LABBARB  ?Alcohol Level No results found for: ETH ? ?IMAGING ? ? ?CT ANGIO HEAD NECK W WO CM ? ?Result Date: 03/13/2022 ?CLINICAL DATA:  Thalamic infarct EXAM: CT ANGIOGRAPHY HEAD AND NECK TECHNIQUE: Multidetector CT imaging of the head and neck was performed using the standard protocol during bolus administration of intravenous contrast. Multiplanar CT image reconstructions and MIPs were obtained to evaluate the vascular anatomy. Carotid stenosis measurements (when applicable) are obtained utilizing  NASCET criteria, using the distal internal carotid diameter as the denominator. RADIATION DOSE REDUCTION: This exam was performed according to the departmental dose-optimization program which includes automated exposure control, adjustment of the mA and/or kV according to patient size and/or use of iterative reconstruction technique. CONTRAST:  60mL OMNIPAQUE IOHEXOL 350 MG/ML SOLN COMPARISON:  None. FINDINGS: CTA NECK FINDINGS SKELETON: There is no bony spinal canal stenosis. No lytic or blastic lesion. OTHER NECK: Normal pharynx, larynx and major salivary glands. No cervical lymphadenopathy. Unremarkable thyroid gland. UPPER CHEST: No pneumothorax or pleural effusion. No nodules or masses. AORTIC ARCH: There is no calcific atherosclerosis of the aortic arch. There is no aneurysm, dissection or hemodynamically significant stenosis of the visualized portion of the aorta. Conventional 3 vessel aortic branching pattern. The visualized proximal subclavian arteries are widely patent. RIGHT CAROTID SYSTEM: Normal without aneurysm, dissection or stenosis. LEFT CAROTID SYSTEM: Normal without aneurysm, dissection or stenosis. VERTEBRAL ARTERIES: Left dominant configuration. Both origins are clearly patent. There is no dissection, occlusion or flow-limiting stenosis to the skull base (V1-V3 segments). CTA HEAD FINDINGS POSTERIOR CIRCULATION: --Vertebral arteries: Normal V4 segments. --Inferior cerebellar arteries: Normal. --Basilar artery: Normal. --Superior cerebellar arteries: Normal. --Posterior cerebral arteries (PCA): Normal. ANTERIOR CIRCULATION: --Intracranial internal carotid arteries: Normal. --Anterior cerebral arteries (ACA): Normal. Both  A1 segments are present. Patent anterior communicating artery (a-comm). --Middle cerebral arteries (MCA): Normal. VENOUS SINUSES: As permitted by contrast timing, patent. ANATOMIC VARIANTS: None Review of the MIP images confirms the above findings. IMPRESSION: Normal CTA of the  head and neck. Electronically Signed   By: Deatra Robinson M.D.   On: 03/13/2022 02:08  ? ?CT HEAD WO CONTRAST ( ) ? ?Result Date: 03/12/2022 ?CLINICAL DATA:  Acute neurological deficit. Stroke suspected. Patient was found on the floor. EXAM: CT HEAD WITHOUT CONTRAST TECHNIQUE: Contiguous axial images were obtained from the base of the skull through the vertex without intravenous contrast. RADIATION DOSE REDUCTION: This exam was performed according to the departmental dose-optimization program which includes automated exposure control, adjustment of the mA and/or kV according to patient size and/or use of iterative reconstruction technique. COMPARISON:  07/11/2012 FINDINGS: Brain: Diffuse cerebral atrophy. Ventricular dilatation consistent with central atrophy. Low-attenuation changes in the deep white matter consistent with small vessel ischemia. Asymmetric low-attenuation in the left thalamus suggesting acute or subacute infarct. Consider MRI for further characterization. Prominent CSF space in the posterior fossa likely representing prominent cisterna magna. No abnormal extra-axial fluid collections. No mass effect or midline shift. Gray-white matter junctions are distinct. Basal cisterns are not effaced. No acute intracranial hemorrhage. Vascular: Moderate intracranial arterial vascular calcifications. Skull: Calvarium appears intact. Sinuses/Orbits: Retention cyst or polyp in the right anterior nasal passage. Paranasal sinuses and mastoid air cells are otherwise clear. Other: None. IMPRESSION: 1. Asymmetrical low-attenuation in the left thalamus suggesting acute or subacute infarct. Consider MRI for further evaluation. 2. Chronic atrophy and small vessel ischemic changes diffusely. 3. No acute intracranial hemorrhage. Electronically Signed   By: Burman Nieves M.D.   On: 03/12/2022 20:23  ? ?MR BRAIN WO CONTRAST ? ?Result Date: 03/12/2022 ?CLINICAL DATA:  Acute neurologic deficit EXAM: MRI HEAD WITHOUT CONTRAST  TECHNIQUE: Multiplanar, multiecho pulse sequences of the brain and surrounding structures were obtained without intravenous contrast. COMPARISON:  None. FINDINGS: Brain: Small acute infarct of the left thalamus extending into the left paramedian midbrain. No acute or chronic hemorrhage. Hyperintense T2-weighted signal is moderately widespread throughout the white matter. Generalized volume loss without a clear lobar predilection. A partially empty sella is incidentally noted. Vascular: Major flow voids are preserved. Skull and upper cervical spine: Normal calvarium and skull base. Visualized upper cervical spine and soft tissues are normal. Sinuses/Orbits:No paranasal sinus fluid levels or advanced mucosal thickening. No mastoid or middle ear effusion. Normal orbits. IMPRESSION: 1. Small acute infarct of the left thalamus extending into the left paramedian midbrain. No hemorrhage or mass effect. 2. Moderate chronic small vessel ischemic disease and generalized volume loss. Electronically Signed   By: Deatra Robinson M.D.   On: 03/12/2022 23:17  ? ?DG CHEST PORT 1 VIEW ? ?Result Date: 03/13/2022 ?CLINICAL DATA:  Patient was found down on the floor. EXAM: PORTABLE CHEST 1 VIEW COMPARISON:  07/05/2014 FINDINGS: Slightly shallow inspiration. Heart size and pulmonary vascularity are normal for technique. Lungs are clear. No pleural effusions. No pneumothorax. Mediastinal contours appear intact. IMPRESSION: No active disease. Electronically Signed   By: Burman Nieves M.D.   On: 03/13/2022 00:16  ? ?ECHOCARDIOGRAM COMPLETE ? ?Result Date: 03/13/2022 ?   ECHOCARDIOGRAM REPORT   Patient Name:   Rachael Lane Date of Exam: 03/13/2022 Medical Rec #:  [id number]      Height:       69.0 in Accession #:    [id number]     Weight:  194.5 lb Date of Birth:  Sep 19, 1951      BSA:          2.042 m? Patient Age:    71 years       BP:           142/86 mmHg Patient Gender: F              HR:           73 bpm. Exam Location:  Inpatient  Procedure: 2D Echo, Cardiac Doppler and Color Doppler Indications:    Stroke  History:        Patient has no prior history of Echocardiogram examinations.                 Risk Factors:Dyslipidemia, Diabetes, Hy

## 2022-03-14 NOTE — Progress Notes (Signed)
Inpatient Diabetes Program Recommendations ? ?AACE/ADA: New Consensus Statement on Inpatient Glycemic Control (2015) ? ?Target Ranges:  Prepandial:   less than 140 mg/dL ?     Peak postprandial:   less than 180 mg/dL (1-2 hours) ?     Critically ill patients:  140 - 180 mg/dL  ? ?Lab Results  ?Component Value Date  ? GLUCAP 295 (H) 03/14/2022  ? HGBA1C 11.0 (H) 03/13/2022  ? ? ?Review of Glycemic Control ? ?Diabetes history: type 2 ?Outpatient Diabetes medications: Tradjenta 5mg  daily, Rybelsus 14 mg daily ?Current orders for Inpatient glycemic control: Novolog 0-9 units correction scale every 4 hours ? ?Inpatient Diabetes Program Recommendations:   ?For discharge medication recommendations: ?Lantus 15 units daily (weight based) ?Continue Tradjenta 5 mg daily and Rybelsus 14 mg daily ? ? RN BSN CDE ?Diabetes Coordinator ?Pager: [phone]  8am-5pm  ? ? ? ?

## 2022-03-14 NOTE — TOC Transition Note (Signed)
Transition of Care (TOC) - CM/SW Discharge Note ? ? ?Patient Details  ?Name: Anamaria Dusenbury [place] ?MRN: [id number] ?Date of Birth: Apr 16, 1951 ? ?Transition of Care [facility]) CM/SW Contact:  ?Baldemar Lenis, LCSW ?Phone Number: ?03/14/2022, 2:35 PM ? ? ?Clinical Narrative:   CSW confirmed with MD that patient can discharge today and sent discharge information to [facility] in [place]. CSW updated POA [name] of plan to discharge today. Transport scheduled with [facility] for next available. ? ?Nurse to call report to [phone], Room 707. ? ? ? ?Final next level of care: Skilled Nursing Facility ?Barriers to Discharge: Barriers Resolved ? ? ?Patient Goals and CMS Choice ?Patient states their goals for this hospitalization and ongoing recovery are:: Pt unable to participate in goal setting due to disorientation. ?CMS Medicare.gov Compare Post Acute Care list provided to:: Patient ?Choice offered to / list presented to : Patient ? ?Discharge Placement ?  ?           ?Patient chooses bed at: [facility], [place] ?Patient to be transferred to facility by: [facility] ?Name of family member notified: [name] ?Patient and family notified of of transfer: 03/14/22 ? ?Discharge Plan and Services ?  ?  ?Post Acute Care Choice: Skilled Nursing Facility          ?  ?  ?  ?  ?  ?  ?  ?  ?  ?  ? ?Social Determinants of Health (SDOH) Interventions ?  ? ? ?Readmission Risk Interventions ?   ? View : No data to display.  ?  ?  ?  ? ? ? ? ? ?

## 2022-03-14 NOTE — Care Management Important Message (Signed)
Important Message ? ?Patient Details  ?Name: Rachael Lane ?MRN: [id number] ?Date of Birth: November 18, 1951 ? ? ?Medicare Important Message Given:  Yes ? ? ? ? ?Christiona Siddique ?03/14/2022, 1:24 PM ?

## 2022-03-15 DIAGNOSIS — I959 Hypotension, unspecified: Secondary | ICD-10-CM | POA: Diagnosis not present

## 2022-03-15 DIAGNOSIS — R2689 Other abnormalities of gait and mobility: Secondary | ICD-10-CM | POA: Diagnosis not present

## 2022-03-15 DIAGNOSIS — M6281 Muscle weakness (generalized): Secondary | ICD-10-CM | POA: Diagnosis not present

## 2022-03-15 DIAGNOSIS — F03A0 Unspecified dementia, mild, without behavioral disturbance, psychotic disturbance, mood disturbance, and anxiety: Secondary | ICD-10-CM | POA: Diagnosis not present

## 2022-03-15 DIAGNOSIS — G4700 Insomnia, unspecified: Secondary | ICD-10-CM | POA: Diagnosis not present

## 2022-03-15 DIAGNOSIS — F5101 Primary insomnia: Secondary | ICD-10-CM | POA: Diagnosis not present

## 2022-03-15 DIAGNOSIS — F0390 Unspecified dementia without behavioral disturbance: Secondary | ICD-10-CM | POA: Diagnosis not present

## 2022-03-15 DIAGNOSIS — R279 Unspecified lack of coordination: Secondary | ICD-10-CM | POA: Diagnosis not present

## 2022-03-15 DIAGNOSIS — R262 Difficulty in walking, not elsewhere classified: Secondary | ICD-10-CM | POA: Diagnosis not present

## 2022-03-15 DIAGNOSIS — F339 Major depressive disorder, recurrent, unspecified: Secondary | ICD-10-CM | POA: Diagnosis not present

## 2022-03-15 DIAGNOSIS — E1151 Type 2 diabetes mellitus with diabetic peripheral angiopathy without gangrene: Secondary | ICD-10-CM | POA: Diagnosis not present

## 2022-03-15 DIAGNOSIS — R41841 Cognitive communication deficit: Secondary | ICD-10-CM | POA: Diagnosis not present

## 2022-03-15 DIAGNOSIS — E785 Hyperlipidemia, unspecified: Secondary | ICD-10-CM | POA: Diagnosis not present

## 2022-03-15 DIAGNOSIS — I10 Essential (primary) hypertension: Secondary | ICD-10-CM | POA: Diagnosis not present

## 2022-03-15 DIAGNOSIS — K219 Gastro-esophageal reflux disease without esophagitis: Secondary | ICD-10-CM | POA: Diagnosis not present

## 2022-03-15 DIAGNOSIS — N390 Urinary tract infection, site not specified: Secondary | ICD-10-CM | POA: Diagnosis not present

## 2022-03-15 DIAGNOSIS — D649 Anemia, unspecified: Secondary | ICD-10-CM | POA: Diagnosis not present

## 2022-03-15 DIAGNOSIS — E1165 Type 2 diabetes mellitus with hyperglycemia: Secondary | ICD-10-CM | POA: Diagnosis not present

## 2022-03-15 DIAGNOSIS — E876 Hypokalemia: Secondary | ICD-10-CM | POA: Diagnosis not present

## 2022-03-15 DIAGNOSIS — E1340 Other specified diabetes mellitus with diabetic neuropathy, unspecified: Secondary | ICD-10-CM | POA: Diagnosis not present

## 2022-03-15 DIAGNOSIS — F32A Depression, unspecified: Secondary | ICD-10-CM | POA: Diagnosis not present

## 2022-03-15 DIAGNOSIS — I639 Cerebral infarction, unspecified: Secondary | ICD-10-CM | POA: Diagnosis not present

## 2022-03-15 DIAGNOSIS — Z7401 Bed confinement status: Secondary | ICD-10-CM | POA: Diagnosis not present

## 2022-03-15 LAB — GLUCOSE, CAPILLARY
Glucose-Capillary: 118 mg/dL — ABNORMAL HIGH (ref 70–99)
Glucose-Capillary: 132 mg/dL — ABNORMAL HIGH (ref 70–99)
Glucose-Capillary: 150 mg/dL — ABNORMAL HIGH (ref 70–99)

## 2022-03-15 NOTE — Discharge Summary (Signed)
Physician Discharge Summary  ?Zanovia Ugalde Boyajian [id number] DOB: [birth year] DOA: 03/12/2022 ? ?PCP: Cher Nakai, MD ? ?Admit date: 03/12/2022 ?Discharge date: 03/15/2022 ? ?Admitted From: Home ?Disposition:  SNF ? ?Recommendations for Outpatient Follow-up:  ?Follow up with PCP in 1-2 weeks ?Follow up with Neurology in 4-6 weeks ?Continue 30-Day Monitor for D/C  ?Please obtain CMP/CBC, Mag, Phos in one week ?Please follow up on the following pending results: ? ?Home Health: No  ?Equipment/Devices: None but will need a 30 day Holter   ? ?Discharge Condition: Stable  ?CODE STATUS: FULL CODE   ?Diet recommendation: Regular Diet Thin Liquids ? ?Brief/Interim Summary: ?The patient is an overweight 71 year old Caucasian female with a past medical history significant for below 2 diabetes mellitus type 2, depression, hyperlipidemia, hypertension, history dementia without with behavioral disturbances, as well as other comorbidities who was transferred from [facility] after she was found on the floor by her friend with an unknown downtime and had difficulty with speech.  She was last seen normal on Sunday, April second 2023 and was found by her friend brought into the ED for further evaluation.  She had speech difficulty with mostly word finding and some slurred speech and was outside the window for intervention.  She was able to tell the ED provider that she fell from a couch 2 days ago but cannot tell much of the information about that and then did tell us that her friend found her.  The friend reported that she has medications from her pharmacy in blister packs and they were unaware but since Thursday last week.  It is unclear if she is compliant to medications and medical treatment but further work-up was done while she was in the ED and the head CT was concerning for left thalamic subacute infarct and MRI of the brain was done and showed an acute to subacute left thalamic infarct extending into the midbrain of the left.   Neurology was consulted and recommended further work-up and she is currently undergoing further investigation of her CVA and currently awaiting PT, OT, SLP evaluations as well as echocardiogram.  Given that she lives alone and has cognitive deficits now secondary to her CVA she likely needs SNF placement.   ? ?She completed her stroke workup and Neurology recommends DAPT for 3 weeks and then just ASA alone. She will need to follow up with PCP and Neurology as an outpatient. She will need a 30-Day Holter Monitor at D/C.   ? ?ADDENDUM 03/15/22: Patient was deemed medically stable to D/C yesterday but unfortunately did not go to the SNF as the facility refused to admit the patient late last night. She is going this AM and remains medically stable to D/C with no acute changes in plan.  ? ?Discharge Diagnoses:  ?Principal Problem: ?  Stroke (cerebrum) ([place]) ?Active Problems: ?  Essential hypertension ?  Hyperlipidemia ?  DM2 (diabetes mellitus, type 2) ([place]) ?  Hypokalemia ?  UTI (urinary tract infection) ?  Dementia without behavioral disturbance ([place]) ?  Hypomagnesemia ?  Hypophosphatemia ?  Depression ? ?Stroke (cerebrum) ([place]) ?-Admitted to Hospitalist Service and admitted based on the TIA/CVA Protocol ?-C/w Telemetry monitoring ?-Stroke Head CT done and showed "Asymmetrical low-attenuation in the left thalamus suggesting acute or subacute infarct. Consider MRI for further evaluation. Chronic atrophy and small vessel ischemic changes diffusely. No acute intracranial hemorrhage" ?-MRI Brain w/o Contrast showed "Small acute infarct of the left thalamus extending into the left paramedian midbrain. No hemorrhage or mass  effect. Moderate chronic small vessel ischemic disease and generalized volume loss" ?-CTA Angio Head and Neck w/wo Contrast done and showed "Normal CTA of the head and neck" ?-Per Neuro allow permissive hypertension for the first 24 to 48 hours and only treat as needed if SBP is greater than 220 with blood  pressures being normalized to less than 140 upon discharge ?-Echocardiogram done and showed EF of 60-65% with normal LV Fxn and had no regional wall motion abnormalities and Left Ventricular Diastolic Parameters with [id number] ?-Continue with neurochecks per protocol ?-Patient was initiated on aspirin 325 mg and is now on ASA 81 mg po Daily and Clopidogrel 75 mg po Daily and they are recommending DAPT for 3 weeks and then just ASA alone  ?-Initiated on high intensity Atorvastatin 80 mg p.o. daily ?-Continue Neurochecks per protocol ?-Checking lipid panel and hemoglobin A1c ?-Lipid panel done and showed a total cholesterol/HDL level of 3.5, cholesterol 126, HDL 36, LDL 60, triglycerides 150, VLDL of 30 ?-Risk factor modification ?-Discussed the case with Neurology and the CVA has affected her vertical gaze and memory/cognition and since she lives alone will need placement  ?-PT consult, OT consult, Speech consult and pending but likely will need SNF given that she lives alone ?-If Afib found on telemetry, will need anticoagulation but will defer decision pending imaging and stroke team evaluation; Neurology recommending 30-Day Holter for D/C as they are likely going to defer Loop Recorder implantation given her cognitive deficits  ?  ?Depression ?-Once Safe to swallow resume Citalopram 40 mg po qHS and Doxepin 25 mg po qHS ?  ?Hypophosphatemia ?-Phos Level low at 3.1 ?-Continue to Monitor and Replete as Necessary ?-Repeat Phos Level in the AM ?  ?Hypomagnesemia ?-Mag Level was 1.5 ?-Replete with IV Mag Sulfate 2 grams and improved to 1.9 ?-Continue to Monitor and Replete as Necessary ?-Repeat Mag Level in the AM ?  ?Dementia without behavioral disturbance ([place]) ?-Continue with prior to admit donepezil 10 mg p.o. daily and doxepin 25 mg p.o. nightly ?  ?UTI (urinary tract infection) ?-Urinalysis done that showed a clear appearance with greater than 500 glucose, 20 ketones, small leukocytes, negative nitrites, few  bacteria, 0-5 squamous epithelial cells, 0-5 RBCs per high-power field and 21-50 WBCs ?-Urine culture showed Multiple Species  ?-Initiated on empiric antibiotics with IV ceftriaxone we will continue for now but since Afebrile and has no Leukocytosis will D/C Abx after today's dose (Day 3/3) ?-Patient is currently afebrile has no leukocytosis as WBC is 7.6 ?  ?Hypokalemia ?-K+ was 2.5 this AM and replete and improved to  ?-Replete with IV K Phos 20 mmol and IV KCl 40 mEQ ?-Continue to Monitor and Replete as Necessary ?-Repeat CMP in the AM  ?  ?Uncontrolled DM2 (diabetes mellitus, type 2) with Hyperglycemia ([place]) ?-Hemoglobin A1c is 11.0 ?-Continue to monitor CBGs and CBGs ranging from 171-248 ?-Initiated on sensitive NovoLog sign scale insulin every 4 while hospitalized ?-C/w Trajenta and Rybelsus and will add Lantus 15 units sq Daily  ?-Now cleared for a Dysphagia 3 Diet  ?-Follow up with PCP as an outpatient for further Blood Sugar Management  ?  ?Hyperlipidemia ?-Lipid panel as above ?-Initiated on high intensity atorvastatin 80 mg p.o. daily ?  ?Essential hypertension ?-Allowing for permissive hypertension recurrent ?-Allow for normalization upon discharge/systolic blood pressure less than 140 ?-Continue monitor blood pressures per protocol ?-Last blood pressure reading was 140/95 ?  ?Discharge Instructions ? ?Discharge Instructions   ? ? Call MD for:  difficulty breathing, headache or visual disturbances   Complete by: As directed ?  ? Call MD for:  extreme fatigue   Complete by: As directed ?  ? Call MD for:  hives   Complete by: As directed ?  ? Call MD for:  persistant dizziness or light-headedness   Complete by: As directed ?  ? Call MD for:  persistant nausea and vomiting   Complete by: As directed ?  ? Call MD for:  redness, tenderness, or signs of infection (pain, swelling, redness, odor or green/yellow discharge around incision site)   Complete by: As directed ?  ? Call MD for:  severe uncontrolled pain    Complete by: As directed ?  ? Call MD for:  temperature >100.4   Complete by: As directed ?  ? Diet - low sodium heart healthy   Complete by: As directed ?  ? Diet Carb Modified   Complete by: As directed ?  ? Sharma Covert

## 2022-03-15 NOTE — Progress Notes (Signed)
Pt expected to D/C this evening to [facility] in [place] for rehab; transportation arrived at 2200 for pt pick up. Staff called facility at that time to ensure their availability to accept pt, and facility staff member that answered the phone states that although the pt was approved, they will not take her this evening. Pt informed of change in plans, and that staff will work to get her transported as early in the morning as possible; pt verbalized understanding. On call provider also notified of pt having to stay through the night, will inform oncoming coverage in AM.  ?

## 2022-03-15 NOTE — Progress Notes (Signed)
CSW notified that patient was not able to discharge to SNF last night as [facility] [place] refused to take patient when [facility] arrived. CSW set up [facility] for patient this morning and notified RN. ? ?Blenda Nicely, LCSW ?Clinical Social Worker ?[phone] ? ?

## 2022-03-20 DIAGNOSIS — R262 Difficulty in walking, not elsewhere classified: Secondary | ICD-10-CM | POA: Diagnosis not present

## 2022-03-20 DIAGNOSIS — E785 Hyperlipidemia, unspecified: Secondary | ICD-10-CM | POA: Diagnosis not present

## 2022-03-20 DIAGNOSIS — E1151 Type 2 diabetes mellitus with diabetic peripheral angiopathy without gangrene: Secondary | ICD-10-CM | POA: Diagnosis not present

## 2022-03-20 DIAGNOSIS — I639 Cerebral infarction, unspecified: Secondary | ICD-10-CM | POA: Diagnosis not present

## 2022-03-22 DIAGNOSIS — I10 Essential (primary) hypertension: Secondary | ICD-10-CM | POA: Diagnosis not present

## 2022-03-22 DIAGNOSIS — D649 Anemia, unspecified: Secondary | ICD-10-CM | POA: Diagnosis not present

## 2022-03-27 ENCOUNTER — Telehealth: Payer: Self-pay | Admitting: *Deleted

## 2022-03-27 NOTE — Telephone Encounter (Signed)
Attempted to contact [facility] in [place] to arrange cardiac event monitor.  Received message, "Call cannot be completed at this time", three times on 03/27/22. ?

## 2022-04-09 DIAGNOSIS — K219 Gastro-esophageal reflux disease without esophagitis: Secondary | ICD-10-CM | POA: Diagnosis not present

## 2022-04-09 DIAGNOSIS — G4700 Insomnia, unspecified: Secondary | ICD-10-CM | POA: Diagnosis not present

## 2022-04-09 DIAGNOSIS — E1140 Type 2 diabetes mellitus with diabetic neuropathy, unspecified: Secondary | ICD-10-CM | POA: Diagnosis not present

## 2022-04-09 DIAGNOSIS — Z7984 Long term (current) use of oral hypoglycemic drugs: Secondary | ICD-10-CM | POA: Diagnosis not present

## 2022-04-09 DIAGNOSIS — Z7982 Long term (current) use of aspirin: Secondary | ICD-10-CM | POA: Diagnosis not present

## 2022-04-09 DIAGNOSIS — I10 Essential (primary) hypertension: Secondary | ICD-10-CM | POA: Diagnosis not present

## 2022-04-09 DIAGNOSIS — F32A Depression, unspecified: Secondary | ICD-10-CM | POA: Diagnosis not present

## 2022-04-09 DIAGNOSIS — F0390 Unspecified dementia without behavioral disturbance: Secondary | ICD-10-CM | POA: Diagnosis not present

## 2022-04-09 DIAGNOSIS — Z8673 Personal history of transient ischemic attack (TIA), and cerebral infarction without residual deficits: Secondary | ICD-10-CM | POA: Diagnosis not present

## 2022-04-17 DIAGNOSIS — I69398 Other sequelae of cerebral infarction: Secondary | ICD-10-CM | POA: Diagnosis not present

## 2022-04-17 DIAGNOSIS — E876 Hypokalemia: Secondary | ICD-10-CM | POA: Diagnosis not present

## 2022-04-17 DIAGNOSIS — I69312 Visuospatial deficit and spatial neglect following cerebral infarction: Secondary | ICD-10-CM | POA: Diagnosis not present

## 2022-04-17 DIAGNOSIS — I129 Hypertensive chronic kidney disease with stage 1 through stage 4 chronic kidney disease, or unspecified chronic kidney disease: Secondary | ICD-10-CM | POA: Diagnosis not present

## 2022-04-17 DIAGNOSIS — E1151 Type 2 diabetes mellitus with diabetic peripheral angiopathy without gangrene: Secondary | ICD-10-CM | POA: Diagnosis not present

## 2022-04-17 DIAGNOSIS — F32A Depression, unspecified: Secondary | ICD-10-CM | POA: Diagnosis not present

## 2022-04-17 DIAGNOSIS — M810 Age-related osteoporosis without current pathological fracture: Secondary | ICD-10-CM | POA: Diagnosis not present

## 2022-04-17 DIAGNOSIS — Z7982 Long term (current) use of aspirin: Secondary | ICD-10-CM | POA: Diagnosis not present

## 2022-04-17 DIAGNOSIS — R531 Weakness: Secondary | ICD-10-CM | POA: Diagnosis not present

## 2022-04-17 DIAGNOSIS — N183 Chronic kidney disease, stage 3 unspecified: Secondary | ICD-10-CM | POA: Diagnosis not present

## 2022-04-17 DIAGNOSIS — Z7902 Long term (current) use of antithrombotics/antiplatelets: Secondary | ICD-10-CM | POA: Diagnosis not present

## 2022-04-17 DIAGNOSIS — F0390 Unspecified dementia without behavioral disturbance: Secondary | ICD-10-CM | POA: Diagnosis not present

## 2022-04-17 DIAGNOSIS — E785 Hyperlipidemia, unspecified: Secondary | ICD-10-CM | POA: Diagnosis not present

## 2022-04-17 DIAGNOSIS — E1122 Type 2 diabetes mellitus with diabetic chronic kidney disease: Secondary | ICD-10-CM | POA: Diagnosis not present

## 2022-04-17 DIAGNOSIS — I639 Cerebral infarction, unspecified: Secondary | ICD-10-CM | POA: Diagnosis not present

## 2022-04-17 DIAGNOSIS — N390 Urinary tract infection, site not specified: Secondary | ICD-10-CM | POA: Diagnosis not present

## 2022-04-17 DIAGNOSIS — I69393 Ataxia following cerebral infarction: Secondary | ICD-10-CM | POA: Diagnosis not present

## 2022-04-17 DIAGNOSIS — E1165 Type 2 diabetes mellitus with hyperglycemia: Secondary | ICD-10-CM | POA: Diagnosis not present

## 2022-04-17 DIAGNOSIS — I69322 Dysarthria following cerebral infarction: Secondary | ICD-10-CM | POA: Diagnosis not present

## 2022-04-24 IMAGING — CT CT HEAD W/O CM
4 series · 15 of 47 positions shown, 17 images · non-contrast
Comparison: 07/11/2012

CLINICAL DATA: Acute neurological deficit. Stroke suspected.
Patient was found on the floor.



[Series 3: head wo · axial · 0.44mm/px · z∈[-200,-80]mm · 7 of 32 slices shown, 9 images]
[im 4/32  brain]
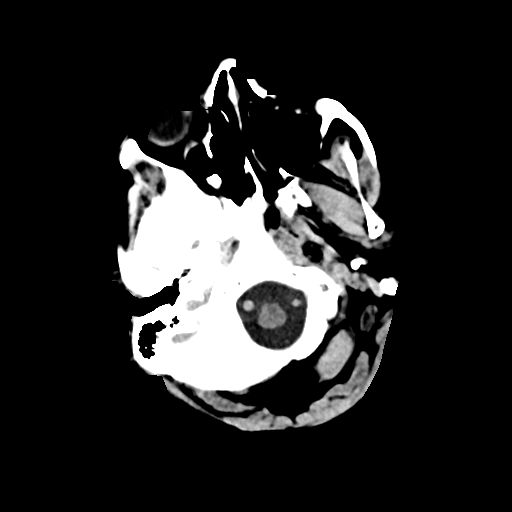
[im 4/32  bone]
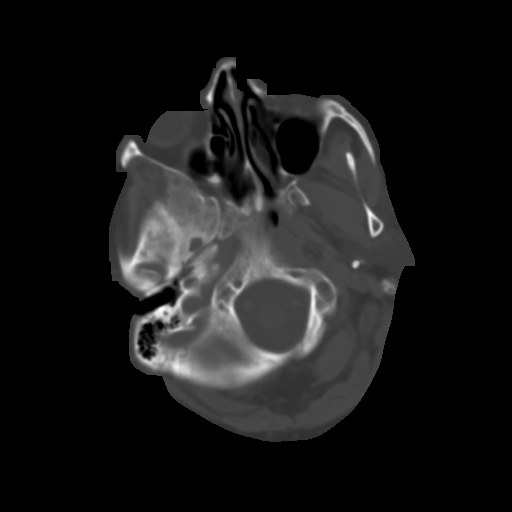
[im 8/32  brain]
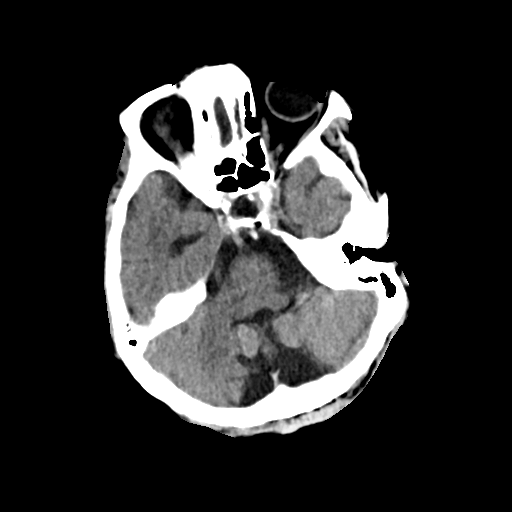
[im 12/32  brain]
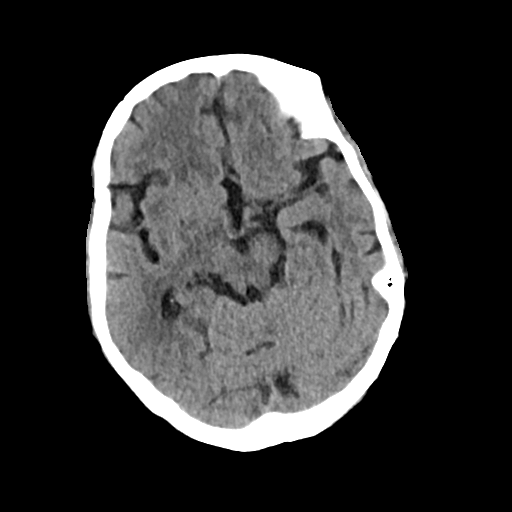
[im 16/32  brain]
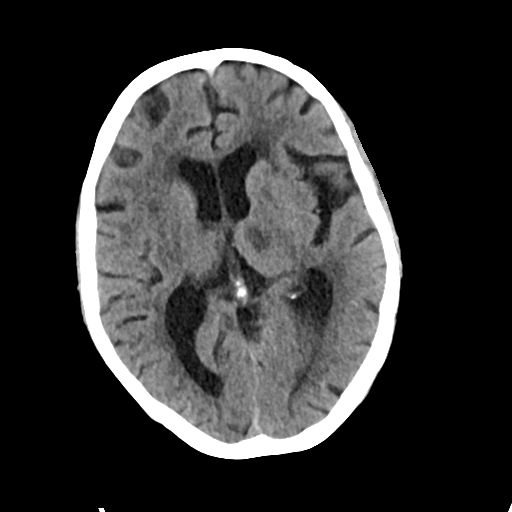
[im 20/32  brain]
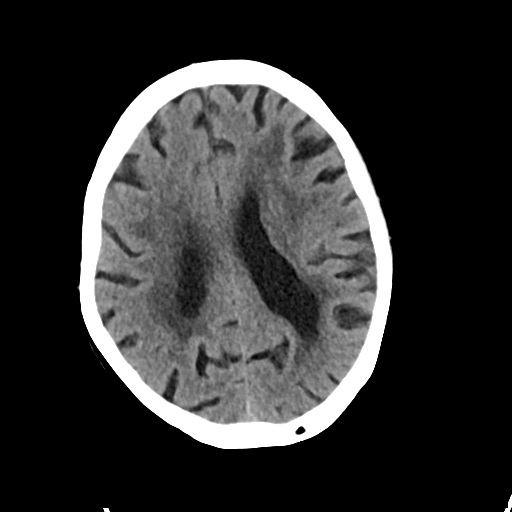
[im 20/32  bone]
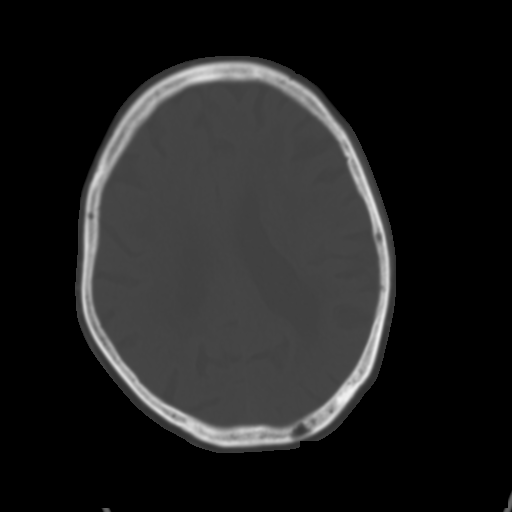
[im 24/32  brain]
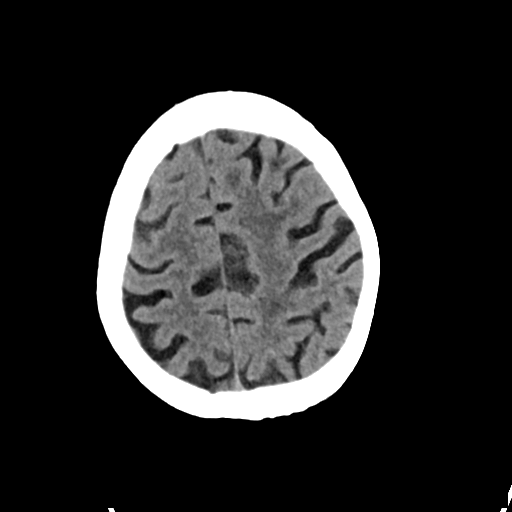
[im 28/32  brain]
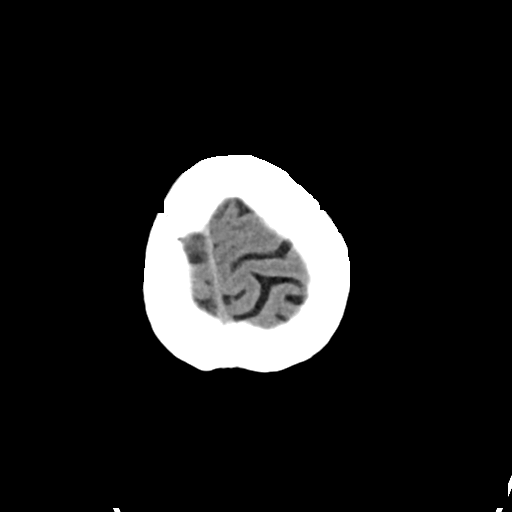

[Series 4: head bone · axial · 0.44mm/px · z∈[-202,-186]mm · 2 of 80 slices shown]
[im 8/80  bone]
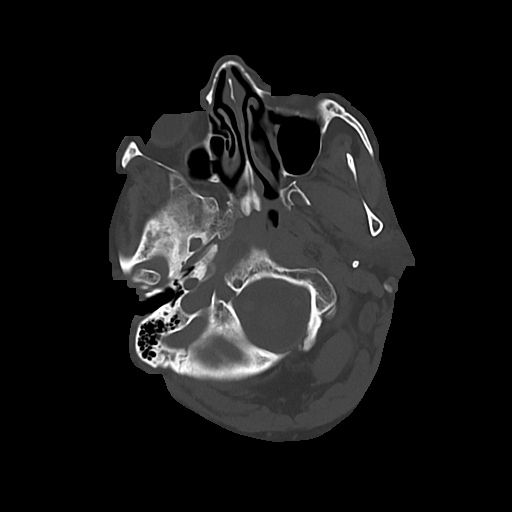
[im 16/80  bone]
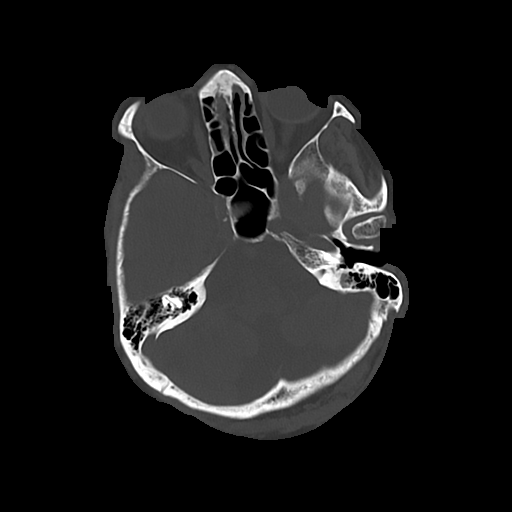

[Series 5: cor soft · coronal · 0.31mm/px · 3 of 75 slices shown]
[im 25/75  brain]
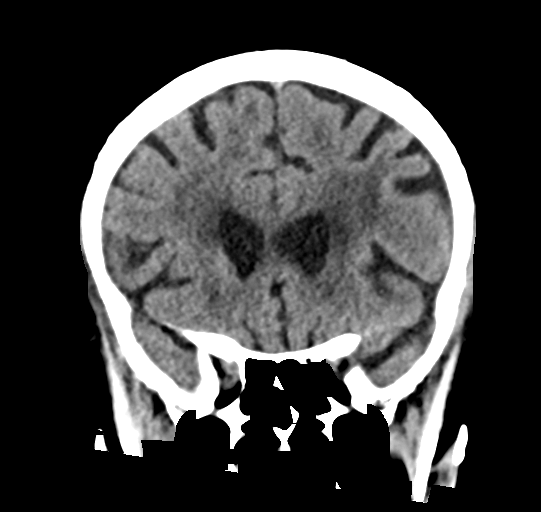
[im 33/75  brain]
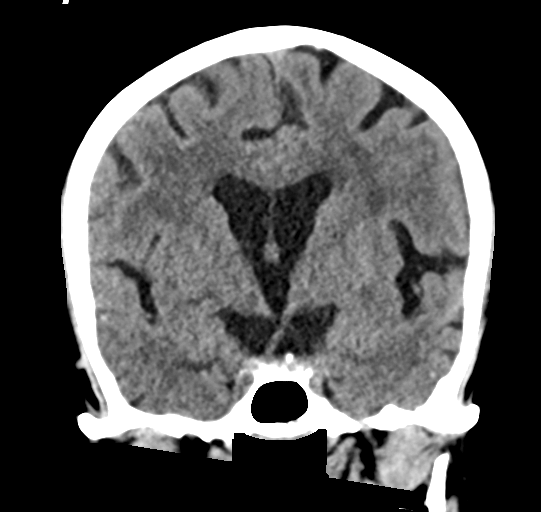
[im 42/75  brain]
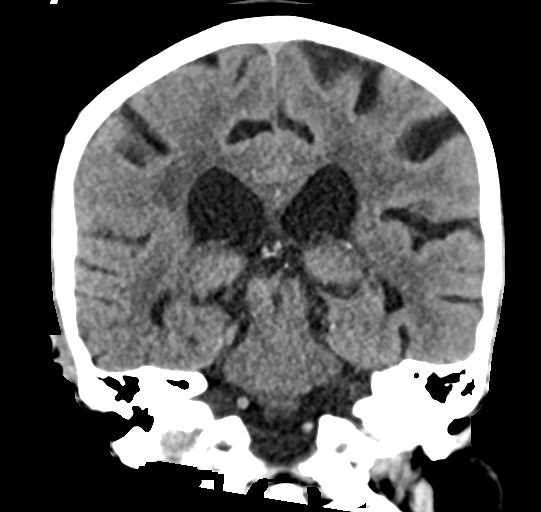

[Series 6: sag soft · sagittal · 0.31mm/px · 3 of 56 slices shown]
[im 21/56  brain]
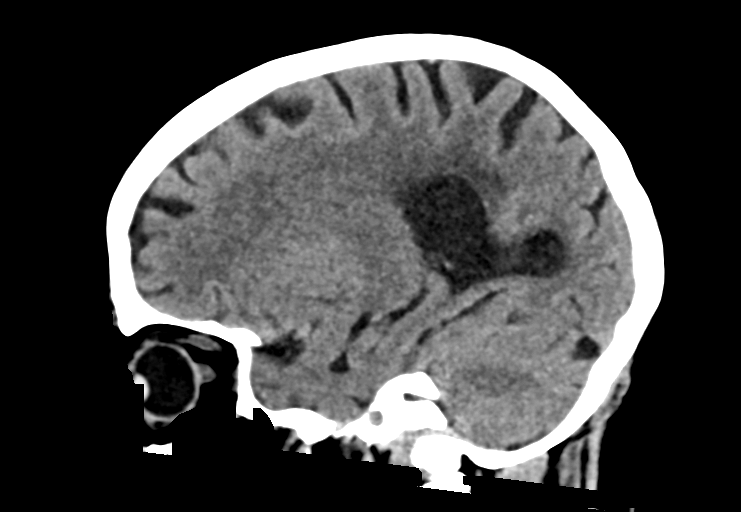
[im 28/56  brain]
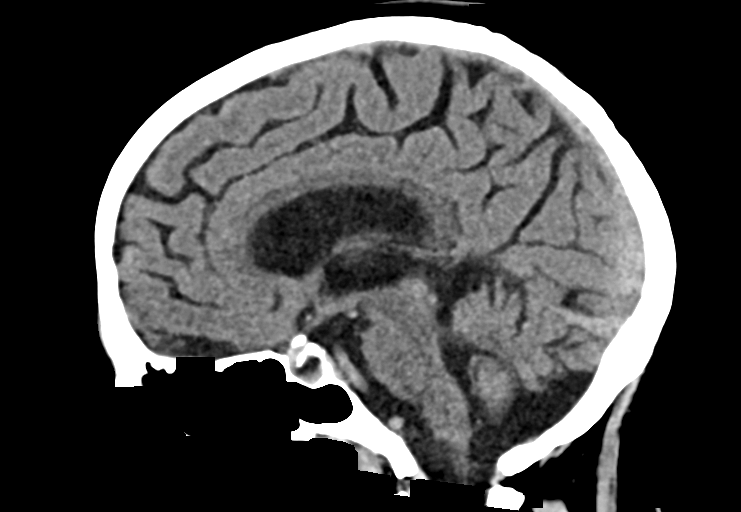
[im 35/56  brain]
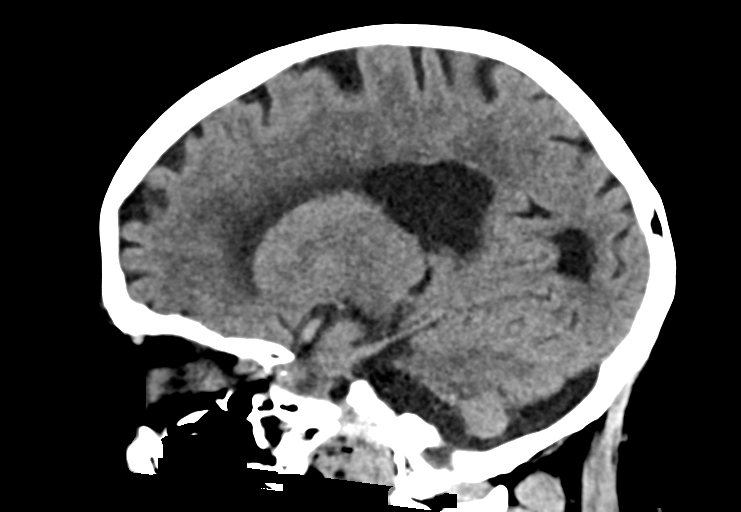

[15 of 47 positions shown; findings below may reference images not displayed]

FINDINGS: Brain: Diffuse cerebral atrophy. Ventricular dilatation consistent
with central atrophy. Low-attenuation changes in the deep white
matter consistent with small vessel ischemia. Asymmetric
low-attenuation in the left thalamus suggesting acute or subacute
infarct. Consider MRI for further characterization. Prominent CSF
space in the posterior fossa likely representing prominent cisterna
magna. No abnormal extra-axial fluid collections. No mass effect or
midline shift. Gray-white matter junctions are distinct. Basal
cisterns are not effaced. No acute intracranial hemorrhage.

Vascular: Moderate intracranial arterial vascular calcifications.

Skull: Calvarium appears intact.

Sinuses/Orbits: Retention cyst or polyp in the right anterior nasal
passage. Paranasal sinuses and mastoid air cells are otherwise
clear.

Other: None.
IMPRESSION: 1. Asymmetrical low-attenuation in the left thalamus suggesting
acute or subacute infarct. Consider MRI for further evaluation.
2. Chronic atrophy and small vessel ischemic changes diffusely.
3. No acute intracranial hemorrhage.

## 2022-04-24 IMAGING — DX DG CHEST 1V PORT
1 series · 1 of 1 positions shown · non-contrast
Comparison: 07/05/2014

CLINICAL DATA: Patient was found down on the floor.

EXAM:
PORTABLE CHEST 1 VIEW

[chest]
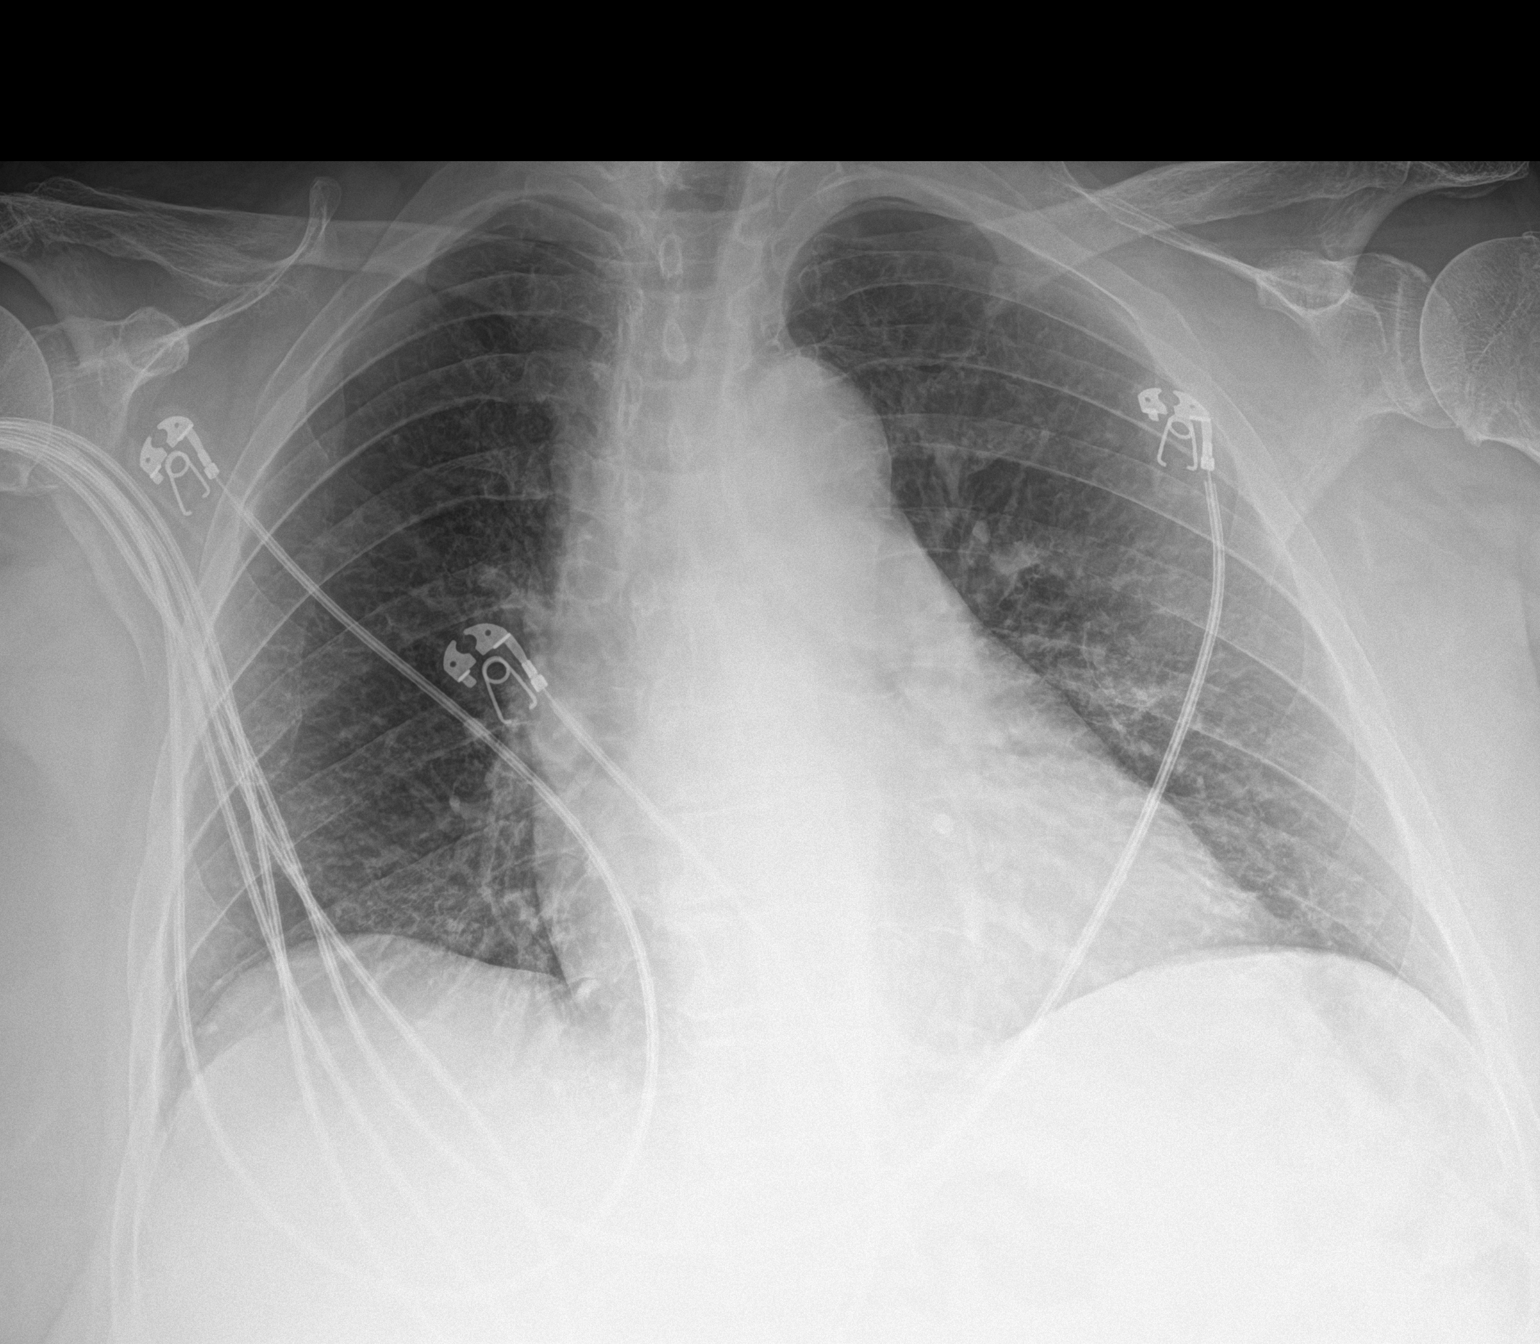

[1 of 1 positions shown; findings below may reference images not displayed]

FINDINGS: Slightly shallow inspiration. Heart size and pulmonary vascularity
are normal for technique. Lungs are clear. No pleural effusions. No
pneumothorax. Mediastinal contours appear intact.
IMPRESSION: No active disease.

## 2022-05-01 ENCOUNTER — Encounter: Payer: Self-pay | Admitting: Diagnostic Neuroimaging

## 2022-05-01 ENCOUNTER — Ambulatory Visit: Payer: PPO | Admitting: Diagnostic Neuroimaging

## 2022-05-01 VITALS — BP 114/77 | HR 58 | Ht 62.0 in | Wt 207.0 lb

## 2022-05-01 DIAGNOSIS — I6381 Other cerebral infarction due to occlusion or stenosis of small artery: Secondary | ICD-10-CM

## 2022-05-01 DIAGNOSIS — F03B0 Unspecified dementia, moderate, without behavioral disturbance, psychotic disturbance, mood disturbance, and anxiety: Secondary | ICD-10-CM

## 2022-05-01 NOTE — Progress Notes (Signed)
[facility]  PATIENT: Rachael Lane DOB: [birth year]  REFERRING CLINICIAN: Cher Nakai, MD HISTORY FROM: patient REASON FOR VISIT: new consult   HISTORICAL  CHIEF COMPLAINT:  Chief Complaint  Patient presents with   Follow-up    Rm 6 with Friend [place] Pt is well, states she is having blurred vision in L eye.     HISTORY OF PRESENT ILLNESS:   71 year old female here for evaluation of stroke.  History of diabetes, hypertension, hyperlipidemia, third grade education, widowed, previously living alone.  Patient was found by a friend with slurred speech and vision changes.  Patient was taken to the hospital and diagnosed with left lambing stroke.  Stroke work-up was completed.  Some cognitive deficits were noted, which may have been present before the stroke and worsened since that time.  Patient was discharged to inpatient rehabilitation.  She was recommended to transition to assisted living but patient declined.  Patient was able to move in with a friend temporarily.  Now patient is planning to move back to her home.  Friend will continue to be involved to help administer insulin and check on daily basis.   REVIEW OF SYSTEMS: Full 14 system review of systems performed and negative with exception of: as per HPI.  ALLERGIES: No Known Allergies  HOME MEDICATIONS: Outpatient Medications Prior to Visit  Medication Sig Dispense Refill   ACCU-CHEK AVIVA PLUS test strip Every morning     aspirin EC 81 MG EC tablet Take 1 tablet (81 mg total) by mouth daily. Swallow whole. 30 tablet 11   atorvastatin (LIPITOR) 80 MG tablet Take 1 tablet (80 mg total) by mouth daily. 30 tablet 0   citalopram (CELEXA) 40 MG tablet Take 40 mg by mouth at bedtime.     cloNIDine (CATAPRES) 0.2 MG tablet Take 0.2 mg by mouth at bedtime.     donepezil (ARICEPT) 10 MG tablet Take 10 mg by mouth daily.     doxepin (SINEQUAN) 25 MG capsule Take 25 mg by mouth at bedtime.     gabapentin  (NEURONTIN) 300 MG capsule Take 300 mg by mouth 3 (three) times daily.     LANTUS SOLOSTAR 100 UNIT/ML Solostar Pen SMARTSIG:24 Unit(s) SUB-Q Daily     NIFEdipine (PROCARDIA-XL/NIFEDICAL-XL) 30 MG 24 hr tablet Take 30 mg by mouth at bedtime.     pantoprazole (PROTONIX) 20 MG tablet Take 20 mg by mouth at bedtime.     propranolol ER (INDERAL LA) 80 MG 24 hr capsule Take 80 mg by mouth at bedtime.     RYBELSUS 14 MG TABS Take 1 tablet by mouth every morning.     temazepam (RESTORIL) 30 MG capsule Take 30 mg by mouth at bedtime.     TRADJENTA 5 MG TABS tablet Take 5 mg by mouth daily.     clopidogrel (PLAVIX) 75 MG tablet Take 1 tablet (75 mg total) by mouth daily. 21 tablet 0   insulin glargine-yfgn (SEMGLEE) 100 UNIT/ML injection Inject 0.15 mLs (15 Units total) into the skin daily. (Patient not taking: Reported on 05/01/2022) 10 mL 11   No facility-administered medications prior to visit.    PAST MEDICAL HISTORY: Past Medical History:  Diagnosis Date   Arthritis    Cataract    Depression    Diabetes mellitus without complication ([place])    Hyperlipidemia    Hypertension     PAST SURGICAL HISTORY: History reviewed. No pertinent surgical history.  FAMILY HISTORY: History reviewed. No pertinent family history.  SOCIAL HISTORY: Social History   Socioeconomic History   Marital status: Widowed    Spouse name: Not on file   Number of children: 0   Years of education: Not on file   Highest education level: 3rd grade  Occupational History   Not on file  Tobacco Use   Smoking status: Never   Smokeless tobacco: Never  Substance and Sexual Activity   Alcohol use: No   Drug use: No   Sexual activity: Not on file  Other Topics Concern   Not on file  Social History Narrative   Not on file   Social Determinants of Health   Financial Resource Strain: Not on file  Food Insecurity: Not on file  Transportation Needs: Not on file  Physical Activity: Not on file  Stress: Not on  file  Social Connections: Not on file  Intimate Partner Violence: Not on file     PHYSICAL EXAM  GENERAL EXAM/CONSTITUTIONAL: Vitals:  Vitals:   05/01/22 0937  BP: 114/77  Pulse: (!) 58  Weight: 207 lb (93.9 kg)  Height: 5\' 2"  (1.575 m)   Body mass index is 37.86 kg/m. Wt Readings from Last 3 Encounters:  05/01/22 207 lb (93.9 kg)  03/12/22 194 lb 8 oz (88.2 kg)   Patient is in no distress; well developed, nourished and groomed; neck is supple  CARDIOVASCULAR: Examination of carotid arteries is normal; no carotid bruits Regular rate and rhythm, no murmurs Examination of peripheral vascular system by observation and palpation is normal  EYES: Ophthalmoscopic exam of optic discs and posterior segments is normal; no papilledema or hemorrhages No results found.  MUSCULOSKELETAL: Gait, strength, tone, movements noted in Neurologic exam below  NEUROLOGIC: MENTAL STATUS:     05/01/2022   10:00 AM  MMSE - Mini Mental State Exam  Orientation to time 2  Orientation to time comments 43, 50, MAY, 24, WED  Orientation to Place 2  Orientation to Place-comments [place], [place], [place], ?, 1ST  Registration 3  Attention/ Calculation 0  Recall 2  Language- name 2 objects 2  Language- repeat 0  Language- follow 3 step command 2  Language- read & follow direction 0  Write a sentence 0  Copy design 0  Total score 13   awake, alert, oriented to person DECR MEMORY, ATTENTION language fluent, comprehension intact, naming intact fund of knowledge appropriate DECR INSIGHT AND JUDGEMENT  CRANIAL NERVE:  2nd - no papilledema on fundoscopic exam 2nd, 3rd, 4th, 6th - pupils 2MM NO REACTION; visual fields full to confrontation, extraocular muscles --> ON RIGHT GAZE, LEFT EYE CANNOT ADDUCT AND RIGHT EYE HAS NYSTAGMUS (left INO); LIMITED UPGAZE; LIMITED DOWNGAZE 5th - facial sensation symmetric 7th - facial strength symmetric 8th - hearing intact 9th - palate  elevates symmetrically, uvula midline 11th - shoulder shrug symmetric 12th - tongue protrusion midline  MOTOR:  normal bulk and tone, full strength in the BUE, BLE  SENSORY:  normal and symmetric to light touch, temperature, vibration  COORDINATION:  finger-nose-finger, fine finger movements normal  REFLEXES:  deep tendon reflexes TRACE and symmetric  GAIT/STATION:  UNSTEADY GAIT    DIAGNOSTIC DATA (LABS, IMAGING, TESTING) - I reviewed patient records, labs, notes, testing and imaging myself where available.  Lab Results  Component Value Date   WBC 5.6 03/14/2022   HGB 12.4 03/14/2022   HCT 36.6 03/14/2022   MCV 88.8 03/14/2022   PLT 202 03/14/2022      Component Value Date/Time   NA 139 03/14/2022  1021   K 3.3 (L) 03/14/2022 1021   CL 108 03/14/2022 1021   CO2 22 03/14/2022 1021   GLUCOSE 264 (H) 03/14/2022 1021   BUN <5 (L) 03/14/2022 1021   CREATININE 1.01 (H) 03/14/2022 1021   CALCIUM 8.0 (L) 03/14/2022 1021   PROT 5.6 (L) 03/14/2022 1021   ALBUMIN 3.0 (L) 03/14/2022 1021   AST 16 03/14/2022 1021   ALT 16 03/14/2022 1021   ALKPHOS 148 (H) 03/14/2022 1021   BILITOT 1.0 03/14/2022 1021   GFRNONAA 60 (L) 03/14/2022 1021   Lab Results  Component Value Date   CHOL 126 03/13/2022   HDL 36 (L) 03/13/2022   LDLCALC 60 03/13/2022   TRIG 150 (H) 03/13/2022   CHOLHDL 3.5 03/13/2022   Lab Results  Component Value Date   HGBA1C 11.0 (H) 03/13/2022   No results found for: VITAMINB12 Lab Results  Component Value Date   TSH 1.968 03/13/2022    03/12/22 MRI brain [I reviewed images myself and agree with interpretation. -VRP]  1. Small acute infarct of the left thalamus extending into the left paramedian midbrain. No hemorrhage or mass effect. 2. Moderate chronic small vessel ischemic disease and generalized volume loss.  03/13/22 CTA head - Normal CTA of the head and neck.  03/13/22 TTE 1. Left ventricular ejection fraction, by estimation, is 60 to 65%. The   left ventricle has normal function. The left ventricle has no regional  wall motion abnormalities. Left ventricular diastolic parameters are  consistent with Grade I diastolic  dysfunction (impaired relaxation).   2. Right ventricular systolic function is normal. The right ventricular  size is normal.   3. The mitral valve is normal in structure. No evidence of mitral valve  regurgitation. No evidence of mitral stenosis.   4. The aortic valve is normal in structure. There is mild calcification  of the aortic valve. Aortic valve regurgitation is not visualized. Aortic  valve sclerosis is present, with no evidence of aortic valve stenosis.   5. The inferior vena cava is normal in size with greater than 50%  respiratory variability, suggesting right atrial pressure of 3 mmHg.      ASSESSMENT AND PLAN  71 y.o. year old female here with:  Dx:  1. Thalamic stroke ([place])   2. Moderate dementia without behavioral disturbance, psychotic disturbance, mood disturbance, or anxiety, unspecified dementia type (H[facility])     PLAN:  LEFT THALAMIC STROKE (small vessel dz vs cryptogenic / embolic) - aspirin 81mg  daily, atorvastatin 80, BP control, DM control - follow up 30 day heart monitor  COGNITIVE IMPAIRMENT (likely moderate dementia) - cannot manage medications, finances; no driving; probably should not be living alone - safety / supervision issues reviewed - daily physical activity / exercise (at least 15-30 minutes) - eat more plants / vegetables - increase social activities, brain stimulation, games, puzzles, hobbies, crafts, arts, music - aim for at least 7-8 hours sleep per night (or more) - avoid smoking and alcohol - caregiver resources provided  Return for return to PCP, pending if symptoms worsen or fail to improve.  I reviewed images, labs, notes, records myself. I summarized findings and reviewed with patient, for this high risk condition (stroke, dementia) requiring high  complexity decision making.   I spent 60 minutes of face-to-face and non-face-to-face time with patient.  This included previsit chart review, lab review, study review, order entry, electronic health record documentation, patient education.     Penni Bombard, MD [id number], 0000000 AM Certified  in Neurology, Neurophysiology and Neuroimaging  [facility] [address], [place], [place] [address] [phone]

## 2022-05-01 NOTE — Patient Instructions (Signed)
  LEFT THALAMIC STROKE (small vessel dz vs cryptogenic / embolic) - aspirin 81mg  daily, atorvastatin 80, BP control, DM control - follow up 30 day heart monitor  COGNITIVE IMPAIRMENT (likely mild-moderate dementia) - safety / supervision issues reviewed - daily physical activity / exercise (at least 15-30 minutes) - eat more plants / vegetables - increase social activities, brain stimulation, games, puzzles, hobbies, crafts, arts, music - aim for at least 7-8 hours sleep per night (or more) - avoid smoking and alcohol - caregiver resources provided - cannot manage medications, finances; no driving; probably should not be living alone

## 2022-05-14 DIAGNOSIS — E785 Hyperlipidemia, unspecified: Secondary | ICD-10-CM | POA: Diagnosis not present

## 2022-05-14 DIAGNOSIS — E1169 Type 2 diabetes mellitus with other specified complication: Secondary | ICD-10-CM | POA: Diagnosis not present

## 2022-05-14 DIAGNOSIS — Z79899 Other long term (current) drug therapy: Secondary | ICD-10-CM | POA: Diagnosis not present

## 2022-05-14 DIAGNOSIS — I10 Essential (primary) hypertension: Secondary | ICD-10-CM | POA: Diagnosis not present

## 2022-05-14 DIAGNOSIS — E782 Mixed hyperlipidemia: Secondary | ICD-10-CM | POA: Diagnosis not present

## 2022-05-14 DIAGNOSIS — Z139 Encounter for screening, unspecified: Secondary | ICD-10-CM | POA: Diagnosis not present

## 2022-05-14 DIAGNOSIS — I639 Cerebral infarction, unspecified: Secondary | ICD-10-CM | POA: Diagnosis not present

## 2022-05-14 DIAGNOSIS — Z6832 Body mass index (BMI) 32.0-32.9, adult: Secondary | ICD-10-CM | POA: Diagnosis not present

## 2022-05-14 DIAGNOSIS — R54 Age-related physical debility: Secondary | ICD-10-CM | POA: Diagnosis not present

## 2022-05-17 DIAGNOSIS — I69312 Visuospatial deficit and spatial neglect following cerebral infarction: Secondary | ICD-10-CM | POA: Diagnosis not present

## 2022-05-17 DIAGNOSIS — N183 Chronic kidney disease, stage 3 unspecified: Secondary | ICD-10-CM | POA: Diagnosis not present

## 2022-05-17 DIAGNOSIS — Z7902 Long term (current) use of antithrombotics/antiplatelets: Secondary | ICD-10-CM | POA: Diagnosis not present

## 2022-05-17 DIAGNOSIS — I69322 Dysarthria following cerebral infarction: Secondary | ICD-10-CM | POA: Diagnosis not present

## 2022-05-17 DIAGNOSIS — R531 Weakness: Secondary | ICD-10-CM | POA: Diagnosis not present

## 2022-05-17 DIAGNOSIS — I129 Hypertensive chronic kidney disease with stage 1 through stage 4 chronic kidney disease, or unspecified chronic kidney disease: Secondary | ICD-10-CM | POA: Diagnosis not present

## 2022-05-17 DIAGNOSIS — N390 Urinary tract infection, site not specified: Secondary | ICD-10-CM | POA: Diagnosis not present

## 2022-05-17 DIAGNOSIS — Z7982 Long term (current) use of aspirin: Secondary | ICD-10-CM | POA: Diagnosis not present

## 2022-05-17 DIAGNOSIS — F32A Depression, unspecified: Secondary | ICD-10-CM | POA: Diagnosis not present

## 2022-05-17 DIAGNOSIS — E1151 Type 2 diabetes mellitus with diabetic peripheral angiopathy without gangrene: Secondary | ICD-10-CM | POA: Diagnosis not present

## 2022-05-17 DIAGNOSIS — I69393 Ataxia following cerebral infarction: Secondary | ICD-10-CM | POA: Diagnosis not present

## 2022-05-17 DIAGNOSIS — E1165 Type 2 diabetes mellitus with hyperglycemia: Secondary | ICD-10-CM | POA: Diagnosis not present

## 2022-05-17 DIAGNOSIS — E785 Hyperlipidemia, unspecified: Secondary | ICD-10-CM | POA: Diagnosis not present

## 2022-05-17 DIAGNOSIS — E1122 Type 2 diabetes mellitus with diabetic chronic kidney disease: Secondary | ICD-10-CM | POA: Diagnosis not present

## 2022-05-17 DIAGNOSIS — M810 Age-related osteoporosis without current pathological fracture: Secondary | ICD-10-CM | POA: Diagnosis not present

## 2022-05-17 DIAGNOSIS — F0390 Unspecified dementia without behavioral disturbance: Secondary | ICD-10-CM | POA: Diagnosis not present

## 2022-05-17 DIAGNOSIS — I69398 Other sequelae of cerebral infarction: Secondary | ICD-10-CM | POA: Diagnosis not present

## 2022-05-17 DIAGNOSIS — E876 Hypokalemia: Secondary | ICD-10-CM | POA: Diagnosis not present

## 2022-05-29 DIAGNOSIS — F3342 Major depressive disorder, recurrent, in full remission: Secondary | ICD-10-CM | POA: Diagnosis not present

## 2022-06-20 DIAGNOSIS — I639 Cerebral infarction, unspecified: Secondary | ICD-10-CM | POA: Diagnosis not present

## 2022-06-20 DIAGNOSIS — R262 Difficulty in walking, not elsewhere classified: Secondary | ICD-10-CM | POA: Diagnosis not present

## 2022-07-16 DIAGNOSIS — E785 Hyperlipidemia, unspecified: Secondary | ICD-10-CM | POA: Diagnosis not present

## 2022-07-16 DIAGNOSIS — E669 Obesity, unspecified: Secondary | ICD-10-CM | POA: Diagnosis not present

## 2022-07-16 DIAGNOSIS — Z0000 Encounter for general adult medical examination without abnormal findings: Secondary | ICD-10-CM | POA: Diagnosis not present

## 2022-07-16 DIAGNOSIS — Z1331 Encounter for screening for depression: Secondary | ICD-10-CM | POA: Diagnosis not present

## 2022-07-16 DIAGNOSIS — Z9181 History of falling: Secondary | ICD-10-CM | POA: Diagnosis not present

## 2022-07-16 DIAGNOSIS — Z6832 Body mass index (BMI) 32.0-32.9, adult: Secondary | ICD-10-CM | POA: Diagnosis not present

## 2022-08-16 DIAGNOSIS — Z23 Encounter for immunization: Secondary | ICD-10-CM | POA: Diagnosis not present

## 2022-08-16 DIAGNOSIS — N183 Chronic kidney disease, stage 3 unspecified: Secondary | ICD-10-CM | POA: Diagnosis not present

## 2022-08-16 DIAGNOSIS — Z79899 Other long term (current) drug therapy: Secondary | ICD-10-CM | POA: Diagnosis not present

## 2022-08-16 DIAGNOSIS — Z6832 Body mass index (BMI) 32.0-32.9, adult: Secondary | ICD-10-CM | POA: Diagnosis not present

## 2022-08-16 DIAGNOSIS — E1122 Type 2 diabetes mellitus with diabetic chronic kidney disease: Secondary | ICD-10-CM | POA: Diagnosis not present

## 2022-08-16 DIAGNOSIS — E1169 Type 2 diabetes mellitus with other specified complication: Secondary | ICD-10-CM | POA: Diagnosis not present

## 2022-08-16 DIAGNOSIS — E785 Hyperlipidemia, unspecified: Secondary | ICD-10-CM | POA: Diagnosis not present

## 2022-08-16 DIAGNOSIS — E782 Mixed hyperlipidemia: Secondary | ICD-10-CM | POA: Diagnosis not present

## 2022-08-16 DIAGNOSIS — K219 Gastro-esophageal reflux disease without esophagitis: Secondary | ICD-10-CM | POA: Diagnosis not present

## 2022-11-15 DIAGNOSIS — E782 Mixed hyperlipidemia: Secondary | ICD-10-CM | POA: Diagnosis not present

## 2022-11-15 DIAGNOSIS — E1169 Type 2 diabetes mellitus with other specified complication: Secondary | ICD-10-CM | POA: Diagnosis not present

## 2022-11-15 DIAGNOSIS — I10 Essential (primary) hypertension: Secondary | ICD-10-CM | POA: Diagnosis not present

## 2022-11-15 DIAGNOSIS — Z79899 Other long term (current) drug therapy: Secondary | ICD-10-CM | POA: Diagnosis not present

## 2022-11-15 DIAGNOSIS — Z6833 Body mass index (BMI) 33.0-33.9, adult: Secondary | ICD-10-CM | POA: Diagnosis not present

## 2022-11-15 DIAGNOSIS — E1122 Type 2 diabetes mellitus with diabetic chronic kidney disease: Secondary | ICD-10-CM | POA: Diagnosis not present

## 2022-11-15 DIAGNOSIS — Z23 Encounter for immunization: Secondary | ICD-10-CM | POA: Diagnosis not present

## 2022-11-15 DIAGNOSIS — E785 Hyperlipidemia, unspecified: Secondary | ICD-10-CM | POA: Diagnosis not present

## 2023-02-14 DIAGNOSIS — Z6834 Body mass index (BMI) 34.0-34.9, adult: Secondary | ICD-10-CM | POA: Diagnosis not present

## 2023-02-14 DIAGNOSIS — I10 Essential (primary) hypertension: Secondary | ICD-10-CM | POA: Diagnosis not present

## 2023-02-14 DIAGNOSIS — E1169 Type 2 diabetes mellitus with other specified complication: Secondary | ICD-10-CM | POA: Diagnosis not present

## 2023-02-14 DIAGNOSIS — E1122 Type 2 diabetes mellitus with diabetic chronic kidney disease: Secondary | ICD-10-CM | POA: Diagnosis not present

## 2023-02-14 DIAGNOSIS — E785 Hyperlipidemia, unspecified: Secondary | ICD-10-CM | POA: Diagnosis not present

## 2023-02-14 DIAGNOSIS — E782 Mixed hyperlipidemia: Secondary | ICD-10-CM | POA: Diagnosis not present

## 2023-04-01 ENCOUNTER — Encounter: Payer: Self-pay | Admitting: *Deleted

## 2023-04-01 ENCOUNTER — Other Ambulatory Visit: Payer: Self-pay | Admitting: Neurology

## 2023-04-01 ENCOUNTER — Ambulatory Visit: Payer: PPO | Admitting: Neurology

## 2023-04-01 ENCOUNTER — Encounter: Payer: Self-pay | Admitting: Neurology

## 2023-04-01 VITALS — BP 133/89 | HR 67 | Ht 62.0 in | Wt 216.0 lb

## 2023-04-01 DIAGNOSIS — I6381 Other cerebral infarction due to occlusion or stenosis of small artery: Secondary | ICD-10-CM

## 2023-04-01 DIAGNOSIS — I4891 Unspecified atrial fibrillation: Secondary | ICD-10-CM

## 2023-04-01 DIAGNOSIS — I63432 Cerebral infarction due to embolism of left posterior cerebral artery: Secondary | ICD-10-CM

## 2023-04-01 DIAGNOSIS — F0390 Unspecified dementia without behavioral disturbance: Secondary | ICD-10-CM

## 2023-04-01 DIAGNOSIS — I493 Ventricular premature depolarization: Secondary | ICD-10-CM

## 2023-04-01 DIAGNOSIS — I639 Cerebral infarction, unspecified: Secondary | ICD-10-CM

## 2023-04-01 NOTE — Progress Notes (Signed)
Patient: Rachael Lane Date of Birth: 1951-08-29  Reason for Visit: Follow up History from: Patient, caregiver Meriam Sprague Primary Neurologist: [facility]   ASSESSMENT AND PLAN 72 y.o. year old female   1.  Thalamic stroke April 2023 2.  Dementia, MOCA 8/30  -Order 30-day cardiac monitor study to evaluate for A-fib in the setting of cryptogenic embolic stroke, hospital note mentioned consideration of loop recorder.  She has significant dementia MoCA 8/30. She is living at home alone with caregiver.  -Discussed the importance of strict management of vascular risk factors with a goal BP less than 130/90, A1c less than 7.0, LDL less than 70 for secondary stroke prevention -Continue aspirin 81 mg daily for secondary stroke prevention -Keep close follow-up with PCP, return here on an as-needed basis  HISTORY OF PRESENT ILLNESS: Today 04/01/23 In summary, Rachael Lane was admitted in April 2023 with acute left thalamic infarct due to likely cryptogenic embolic source.  She was advised to continue aspirin 81 mg daily.  Recommended 30-day outpatient cardiac monitor.  Consider loop recorder.  Her A1c was 11.  She was a poor historian with poor insight into her condition.  Underlying cognitive impairment.  Her exam was significant for cognitive impairment along with vertical gaze palsy. Today her [facility] 8/30. She is living at home. Has caregiver a few days a week. She never wore cardiac monitor. Remains on aspirin 81 mg daily, Lipitor 80 mg daily. BP 133/89. She is no longer driving. Goes to [facility], sees PA there. Uses walker to get around, no falls. Since stroke, has some double vision. Last A1C was a 10.   HISTORY 05/01/22 Dr. Marjory Lies: 72 year old female here for evaluation of stroke. History of diabetes, hypertension, hyperlipidemia, third grade education, widowed, previously living alone. Patient was found by a friend with slurred speech and vision changes. Patient was taken to the  hospital and diagnosed with left lambing stroke. Stroke work-up was completed. Some cognitive deficits were noted, which may have been present before the stroke and worsened since that time. Patient was discharged to inpatient rehabilitation. She was recommended to transition to assisted living but patient declined. Patient was able to move in with a friend temporarily. Now patient is planning to move back to her home. Friend will continue to be involved to help administer insulin and check on daily basis.    REVIEW OF SYSTEMS: Out of a complete 14 system review of symptoms, the patient complains only of the following symptoms, and all other reviewed systems are negative.  See HPI  ALLERGIES: No Known Allergies  HOME MEDICATIONS: Outpatient Medications Prior to Visit  Medication Sig Dispense Refill   ACCU-CHEK AVIVA PLUS test strip Every morning     aspirin EC 81 MG EC tablet Take 1 tablet (81 mg total) by mouth daily. Swallow whole. 30 tablet 11   atorvastatin (LIPITOR) 80 MG tablet Take 1 tablet (80 mg total) by mouth daily. 30 tablet 0   citalopram (CELEXA) 40 MG tablet Take 40 mg by mouth at bedtime.     cloNIDine (CATAPRES) 0.2 MG tablet Take 0.2 mg by mouth at bedtime.     donepezil (ARICEPT) 10 MG tablet Take 10 mg by mouth daily.     doxepin (SINEQUAN) 25 MG capsule Take 25 mg by mouth at bedtime.     gabapentin (NEURONTIN) 300 MG capsule Take 300 mg by mouth 3 (three) times daily.     LANTUS SOLOSTAR 100 UNIT/ML Solostar Pen SMARTSIG:24 Unit(s) SUB-Q Daily  NIFEdipine (PROCARDIA-XL/NIFEDICAL-XL) 30 MG 24 hr tablet Take 30 mg by mouth at bedtime.     pantoprazole (PROTONIX) 20 MG tablet Take 20 mg by mouth at bedtime.     propranolol ER (INDERAL LA) 80 MG 24 hr capsule Take 80 mg by mouth at bedtime.     RYBELSUS 14 MG TABS Take 1 tablet by mouth every morning.     temazepam (RESTORIL) 30 MG capsule Take 30 mg by mouth at bedtime.     TRADJENTA 5 MG TABS tablet Take 5 mg by mouth  daily.     No facility-administered medications prior to visit.    PAST MEDICAL HISTORY: Past Medical History:  Diagnosis Date   Arthritis    Cataract    Depression    Diabetes mellitus without complication    Hyperlipidemia    Hypertension     PAST SURGICAL HISTORY: History reviewed. No pertinent surgical history.  FAMILY HISTORY: History reviewed. No pertinent family history.  SOCIAL HISTORY: Social History   Socioeconomic History   Marital status: Widowed    Spouse name: Not on file   Number of children: 0   Years of education: Not on file   Highest education level: 3rd grade  Occupational History   Not on file  Tobacco Use   Smoking status: Never   Smokeless tobacco: Never  Substance and Sexual Activity   Alcohol use: No   Drug use: No   Sexual activity: Not Currently  Other Topics Concern   Not on file  Social History Narrative   Not on file   Social Determinants of Health   Financial Resource Strain: Not on file  Food Insecurity: Not on file  Transportation Needs: Not on file  Physical Activity: Not on file  Stress: Not on file  Social Connections: Not on file  Intimate Partner Violence: Not on file   PHYSICAL EXAM  Vitals:   04/01/23 1537  BP: 133/89  Pulse: 67  Weight: 216 lb (98 kg)  Height:  (1.575 m)   Body mass index is 39.51 kg/m.    04/01/2023    3:40 PM  Montreal Cognitive Assessment   Visuospatial/ Executive (0/5) 2  Naming (0/3) 1  Attention: Read list of digits (0/2) 0  Attention: Read list of letters (0/1) 0  Attention: Serial 7 subtraction starting at 100 (0/3) 0  Language: Repeat phrase (0/2) 0  Language : Fluency (0/1) 0  Abstraction (0/2) 1  Delayed Recall (0/5) 0  Orientation (0/6) 4  Total 8   Generalized: Well developed, in no acute distress, elderly female Neurological examination  Mentation: Alert oriented, cooperative, most history is provided by her caregiver, patient is very pleasant, speech is  clear Cranial nerve II-XII: Pupils were equal round reactive to light, sluggish. Right hypotropia, trouble with vertical gaze  Facial sensation and strength were normal.  Head turning and shoulder shrug  were normal and symmetric. Motor: The motor testing reveals 5 over 5 strength of all 4 extremities. Good symmetric motor tone is noted throughout.  Sensory: Sensory testing is intact to soft touch on all 4 extremities. No evidence of extinction is noted.  Coordination: Cerebellar testing reveals good finger-nose-finger and heel-to-shin bilaterally.  Gait and station: Gait is wide-based, cautious, uses rolling walker in the hallway Reflexes: Deep tendon reflexes are symmetric but decreased  DIAGNOSTIC DATA (LABS, IMAGING, TESTING) - I reviewed patient records, labs, notes, testing and imaging myself where available.  Lab Results  Component Value Date  WBC 5.6 03/14/2022   HGB 12.4 03/14/2022   HCT 36.6 03/14/2022   MCV 88.8 03/14/2022   PLT 202 03/14/2022      Component Value Date/Time   NA 139 03/14/2022 1021   K 3.3 (L) 03/14/2022 1021   CL 108 03/14/2022 1021   CO2 22 03/14/2022 1021   GLUCOSE 264 (H) 03/14/2022 1021   BUN <5 (L) 03/14/2022 1021   CREATININE 1.01 (H) 03/14/2022 1021   CALCIUM 8.0 (L) 03/14/2022 1021   PROT 5.6 (L) 03/14/2022 1021   ALBUMIN 3.0 (L) 03/14/2022 1021   AST 16 03/14/2022 1021   ALT 16 03/14/2022 1021   ALKPHOS 148 (H) 03/14/2022 1021   BILITOT 1.0 03/14/2022 1021   GFRNONAA 60 (L) 03/14/2022 1021   Lab Results  Component Value Date   CHOL 126 03/13/2022   HDL 36 (L) 03/13/2022   LDLCALC 60 03/13/2022   TRIG 150 (H) 03/13/2022   CHOLHDL 3.5 03/13/2022   Lab Results  Component Value Date   HGBA1C 11.0 (H) 03/13/2022   No results found for: "VITAMINB12" Lab Results  Component Value Date   TSH 1.968 03/13/2022    Margie Ege, AGNP-C, DNP 04/01/2023, 4:14 PM [facility] [address], [address] [place], [place]  [address] [phone]

## 2023-04-01 NOTE — Patient Instructions (Addendum)
I will order 30 day cardiac monitor study to look for a-fib Continue aspirin 81 mg daily Strict management of vascular risk factors with a goal BP less than 130/90, A1c less than 7.0, LDL less than 70 for secondary stroke prevention Keep close follow up with primary care

## 2023-04-05 DIAGNOSIS — I4891 Unspecified atrial fibrillation: Secondary | ICD-10-CM | POA: Diagnosis not present

## 2023-04-05 DIAGNOSIS — I639 Cerebral infarction, unspecified: Secondary | ICD-10-CM | POA: Diagnosis not present

## 2023-04-05 DIAGNOSIS — I493 Ventricular premature depolarization: Secondary | ICD-10-CM | POA: Diagnosis not present

## 2023-04-07 ENCOUNTER — Telehealth: Payer: Self-pay | Admitting: Neurology

## 2023-04-07 NOTE — Telephone Encounter (Signed)
Called and informed pt. Pt verbalized understanding.

## 2023-04-07 NOTE — Telephone Encounter (Signed)
Pt friend reports that the adhesive that came with the heart monitor will not stick to pt.  She is asking for a call re: what they should do.

## 2023-04-28 DIAGNOSIS — F3342 Major depressive disorder, recurrent, in full remission: Secondary | ICD-10-CM | POA: Diagnosis not present

## 2023-05-19 DIAGNOSIS — E1169 Type 2 diabetes mellitus with other specified complication: Secondary | ICD-10-CM | POA: Diagnosis not present

## 2023-05-19 DIAGNOSIS — N183 Chronic kidney disease, stage 3 unspecified: Secondary | ICD-10-CM | POA: Diagnosis not present

## 2023-05-19 DIAGNOSIS — E782 Mixed hyperlipidemia: Secondary | ICD-10-CM | POA: Diagnosis not present

## 2023-05-19 DIAGNOSIS — E785 Hyperlipidemia, unspecified: Secondary | ICD-10-CM | POA: Diagnosis not present

## 2023-05-19 DIAGNOSIS — I10 Essential (primary) hypertension: Secondary | ICD-10-CM | POA: Diagnosis not present

## 2023-05-19 DIAGNOSIS — E1122 Type 2 diabetes mellitus with diabetic chronic kidney disease: Secondary | ICD-10-CM | POA: Diagnosis not present

## 2023-05-19 DIAGNOSIS — Z6833 Body mass index (BMI) 33.0-33.9, adult: Secondary | ICD-10-CM | POA: Diagnosis not present

## 2023-05-19 DIAGNOSIS — R54 Age-related physical debility: Secondary | ICD-10-CM | POA: Diagnosis not present

## 2023-05-19 DIAGNOSIS — Z139 Encounter for screening, unspecified: Secondary | ICD-10-CM | POA: Diagnosis not present

## 2023-07-15 DIAGNOSIS — J209 Acute bronchitis, unspecified: Secondary | ICD-10-CM | POA: Diagnosis not present

## 2023-07-15 DIAGNOSIS — R059 Cough, unspecified: Secondary | ICD-10-CM | POA: Diagnosis not present

## 2023-07-15 DIAGNOSIS — Z6833 Body mass index (BMI) 33.0-33.9, adult: Secondary | ICD-10-CM | POA: Diagnosis not present

## 2023-07-15 DIAGNOSIS — I10 Essential (primary) hypertension: Secondary | ICD-10-CM | POA: Diagnosis not present

## 2023-08-19 DIAGNOSIS — G629 Polyneuropathy, unspecified: Secondary | ICD-10-CM | POA: Diagnosis not present

## 2023-08-19 DIAGNOSIS — N183 Chronic kidney disease, stage 3 unspecified: Secondary | ICD-10-CM | POA: Diagnosis not present

## 2023-08-19 DIAGNOSIS — Z9181 History of falling: Secondary | ICD-10-CM | POA: Diagnosis not present

## 2023-08-19 DIAGNOSIS — R54 Age-related physical debility: Secondary | ICD-10-CM | POA: Diagnosis not present

## 2023-08-19 DIAGNOSIS — E1169 Type 2 diabetes mellitus with other specified complication: Secondary | ICD-10-CM | POA: Diagnosis not present

## 2023-08-19 DIAGNOSIS — Z6833 Body mass index (BMI) 33.0-33.9, adult: Secondary | ICD-10-CM | POA: Diagnosis not present

## 2023-08-19 DIAGNOSIS — E785 Hyperlipidemia, unspecified: Secondary | ICD-10-CM | POA: Diagnosis not present

## 2023-08-19 DIAGNOSIS — E1122 Type 2 diabetes mellitus with diabetic chronic kidney disease: Secondary | ICD-10-CM | POA: Diagnosis not present

## 2023-08-19 DIAGNOSIS — E782 Mixed hyperlipidemia: Secondary | ICD-10-CM | POA: Diagnosis not present

## 2024-01-16 DIAGNOSIS — Z23 Encounter for immunization: Secondary | ICD-10-CM | POA: Diagnosis not present

## 2024-01-16 DIAGNOSIS — E782 Mixed hyperlipidemia: Secondary | ICD-10-CM | POA: Diagnosis not present

## 2024-01-16 DIAGNOSIS — Z6833 Body mass index (BMI) 33.0-33.9, adult: Secondary | ICD-10-CM | POA: Diagnosis not present

## 2024-01-16 DIAGNOSIS — N183 Chronic kidney disease, stage 3 unspecified: Secondary | ICD-10-CM | POA: Diagnosis not present

## 2024-01-16 DIAGNOSIS — E785 Hyperlipidemia, unspecified: Secondary | ICD-10-CM | POA: Diagnosis not present

## 2024-01-16 DIAGNOSIS — R54 Age-related physical debility: Secondary | ICD-10-CM | POA: Diagnosis not present

## 2024-01-16 DIAGNOSIS — E1122 Type 2 diabetes mellitus with diabetic chronic kidney disease: Secondary | ICD-10-CM | POA: Diagnosis not present

## 2024-01-16 DIAGNOSIS — E1169 Type 2 diabetes mellitus with other specified complication: Secondary | ICD-10-CM | POA: Diagnosis not present

## 2024-02-03 DIAGNOSIS — Z0000 Encounter for general adult medical examination without abnormal findings: Secondary | ICD-10-CM | POA: Diagnosis not present

## 2024-02-03 DIAGNOSIS — Z9181 History of falling: Secondary | ICD-10-CM | POA: Diagnosis not present

## 2024-02-03 DIAGNOSIS — Z1331 Encounter for screening for depression: Secondary | ICD-10-CM | POA: Diagnosis not present

## 2024-02-23 DIAGNOSIS — F4323 Adjustment disorder with mixed anxiety and depressed mood: Secondary | ICD-10-CM | POA: Diagnosis not present

## 2024-04-16 DIAGNOSIS — E785 Hyperlipidemia, unspecified: Secondary | ICD-10-CM | POA: Diagnosis not present

## 2024-04-16 DIAGNOSIS — N183 Chronic kidney disease, stage 3 unspecified: Secondary | ICD-10-CM | POA: Diagnosis not present

## 2024-04-16 DIAGNOSIS — E782 Mixed hyperlipidemia: Secondary | ICD-10-CM | POA: Diagnosis not present

## 2024-04-16 DIAGNOSIS — E1122 Type 2 diabetes mellitus with diabetic chronic kidney disease: Secondary | ICD-10-CM | POA: Diagnosis not present

## 2024-04-16 DIAGNOSIS — K219 Gastro-esophageal reflux disease without esophagitis: Secondary | ICD-10-CM | POA: Diagnosis not present

## 2024-04-16 DIAGNOSIS — Z6832 Body mass index (BMI) 32.0-32.9, adult: Secondary | ICD-10-CM | POA: Diagnosis not present

## 2024-04-16 DIAGNOSIS — I10 Essential (primary) hypertension: Secondary | ICD-10-CM | POA: Diagnosis not present

## 2024-04-16 DIAGNOSIS — G629 Polyneuropathy, unspecified: Secondary | ICD-10-CM | POA: Diagnosis not present

## 2024-04-16 DIAGNOSIS — E1169 Type 2 diabetes mellitus with other specified complication: Secondary | ICD-10-CM | POA: Diagnosis not present

## 2024-04-16 DIAGNOSIS — R413 Other amnesia: Secondary | ICD-10-CM | POA: Diagnosis not present

## 2024-06-21 DIAGNOSIS — I10 Essential (primary) hypertension: Secondary | ICD-10-CM | POA: Diagnosis not present

## 2024-06-21 DIAGNOSIS — J984 Other disorders of lung: Secondary | ICD-10-CM | POA: Diagnosis not present

## 2024-06-21 DIAGNOSIS — R9431 Abnormal electrocardiogram [ECG] [EKG]: Secondary | ICD-10-CM | POA: Diagnosis not present

## 2024-06-21 DIAGNOSIS — R079 Chest pain, unspecified: Secondary | ICD-10-CM | POA: Diagnosis not present

## 2024-06-28 DIAGNOSIS — M940 Chondrocostal junction syndrome [Tietze]: Secondary | ICD-10-CM | POA: Diagnosis not present

## 2024-06-28 DIAGNOSIS — Z8673 Personal history of transient ischemic attack (TIA), and cerebral infarction without residual deficits: Secondary | ICD-10-CM | POA: Diagnosis not present

## 2024-06-28 DIAGNOSIS — Z6832 Body mass index (BMI) 32.0-32.9, adult: Secondary | ICD-10-CM | POA: Diagnosis not present

## 2024-06-28 DIAGNOSIS — I10 Essential (primary) hypertension: Secondary | ICD-10-CM | POA: Diagnosis not present

## 2024-06-28 DIAGNOSIS — G629 Polyneuropathy, unspecified: Secondary | ICD-10-CM | POA: Diagnosis not present

## 2024-07-22 DIAGNOSIS — E1169 Type 2 diabetes mellitus with other specified complication: Secondary | ICD-10-CM | POA: Diagnosis not present

## 2024-07-22 DIAGNOSIS — E782 Mixed hyperlipidemia: Secondary | ICD-10-CM | POA: Diagnosis not present

## 2024-07-22 DIAGNOSIS — E669 Obesity, unspecified: Secondary | ICD-10-CM | POA: Diagnosis not present

## 2024-07-22 DIAGNOSIS — Z6831 Body mass index (BMI) 31.0-31.9, adult: Secondary | ICD-10-CM | POA: Diagnosis not present

## 2024-07-22 DIAGNOSIS — E785 Hyperlipidemia, unspecified: Secondary | ICD-10-CM | POA: Diagnosis not present

## 2024-07-22 DIAGNOSIS — G629 Polyneuropathy, unspecified: Secondary | ICD-10-CM | POA: Diagnosis not present

## 2024-07-22 DIAGNOSIS — E1122 Type 2 diabetes mellitus with diabetic chronic kidney disease: Secondary | ICD-10-CM | POA: Diagnosis not present

## 2024-07-22 DIAGNOSIS — N183 Chronic kidney disease, stage 3 unspecified: Secondary | ICD-10-CM | POA: Diagnosis not present

## 2024-07-22 DIAGNOSIS — I10 Essential (primary) hypertension: Secondary | ICD-10-CM | POA: Diagnosis not present

## 2024-08-24 DIAGNOSIS — F331 Major depressive disorder, recurrent, moderate: Secondary | ICD-10-CM | POA: Diagnosis not present

## 2024-10-12 ENCOUNTER — Telehealth: Payer: Self-pay | Admitting: Neurology

## 2024-10-12 NOTE — Telephone Encounter (Signed)
 Pt friend Olam called to request letter be sent to Pt lawyer stating that  Pt is in right mind to make these decision to change Power of attorney  Olam call back  [phone]  Lawyer name is dasie Ronde - fax is  [phone]

## 2024-10-14 NOTE — Telephone Encounter (Signed)
 Pt's niece(Angel Jyl [phone]) called and made an appointment

## 2024-10-25 DIAGNOSIS — E785 Hyperlipidemia, unspecified: Secondary | ICD-10-CM | POA: Diagnosis not present

## 2024-10-25 DIAGNOSIS — N183 Chronic kidney disease, stage 3 unspecified: Secondary | ICD-10-CM | POA: Diagnosis not present

## 2024-10-25 DIAGNOSIS — E1169 Type 2 diabetes mellitus with other specified complication: Secondary | ICD-10-CM | POA: Diagnosis not present

## 2024-10-25 DIAGNOSIS — E782 Mixed hyperlipidemia: Secondary | ICD-10-CM | POA: Diagnosis not present

## 2024-10-25 DIAGNOSIS — E1122 Type 2 diabetes mellitus with diabetic chronic kidney disease: Secondary | ICD-10-CM | POA: Diagnosis not present

## 2024-10-25 DIAGNOSIS — G629 Polyneuropathy, unspecified: Secondary | ICD-10-CM | POA: Diagnosis not present

## 2024-10-25 DIAGNOSIS — I10 Essential (primary) hypertension: Secondary | ICD-10-CM | POA: Diagnosis not present

## 2024-10-25 DIAGNOSIS — E669 Obesity, unspecified: Secondary | ICD-10-CM | POA: Diagnosis not present

## 2024-11-01 ENCOUNTER — Telehealth: Payer: Self-pay | Admitting: Neurology

## 2024-11-01 NOTE — Telephone Encounter (Signed)
 Rachael Lane(niece not on [place]) [phone] is asking to be called when an appointment opens earlier. She is who will bring pt to appointment. Pt's appointment details were confirmed and pt is on wait list

## 2024-11-30 ENCOUNTER — Encounter: Payer: Self-pay | Admitting: Neurology

## 2024-11-30 ENCOUNTER — Ambulatory Visit: Admitting: Neurology

## 2024-11-30 VITALS — BP 113/78 | HR 73 | Ht 66.0 in | Wt 198.0 lb

## 2024-11-30 DIAGNOSIS — F0390 Unspecified dementia without behavioral disturbance: Secondary | ICD-10-CM

## 2024-11-30 DIAGNOSIS — I6381 Other cerebral infarction due to occlusion or stenosis of small artery: Secondary | ICD-10-CM | POA: Diagnosis not present

## 2024-11-30 DIAGNOSIS — I63432 Cerebral infarction due to embolism of left posterior cerebral artery: Secondary | ICD-10-CM | POA: Diagnosis not present

## 2024-11-30 NOTE — Progress Notes (Signed)
 "   Patient: Rachael Lane Date of Birth: 03-17-1951  Reason for Visit: Follow up History from: Patient, niece Rachael Lane Primary Neurologist: [facility]   ASSESSMENT AND PLAN 73 y.o. year old female   1.  Thalamic stroke April 2023 2.  Dementia  - Never had 30-day cardiac monitor study to evaluate for A-fib in the setting of cryptogenic embolic stroke. Will order to complete work up. - Recommend aspirin  81 mg daily for secondary stroke prevention - Strict management of vascular risk factors with a goal BP less than 130/90, A1c less than 7.0, LDL less than 70 for secondary stroke prevention - Difficulty completing memory testing due to vision issue, has difficulty with vertical gaze post stroke, planning to have cataract surgery in Feb 2026 - Letter provided to be given to her lawyer, she wishes to make changes to her POA. I feel she is competent to make these changes  - Keep close follow-up with PCP, return here on an as-needed basis  HISTORY OF PRESENT ILLNESS: Today 11/30/2024 11/30/24 SS: Here with her niece, Rachael Lane. Didn't have the cardiac monitor. The caregiver  we met last time is no longer in the picture. She lives alone with an aide who comes few hours a week.  Has not been taking aspirin . Says memory is doing just fine. Family has noticed some delay in recall with name. Since family back involved, she is doing better, Does her own ADLs, laundry, housekeeping. Good mood and spirits. She doesn't drive. Angel manages her meds. Sleeps and eats well. BP is good 113/78. Sees PCP regularly, able to lower insulin . Going to have cataract surgery to right eye 01/10/25. She wants to switch her POA, lawyer needs letter she can do so. Hard time with memory testing because vision issue on MMSE/MOCA, She is alert, answers all orientation questions correctly.   Update 04/01/23 SS: In summary, Rachael Lane was admitted in April 2023 with acute left thalamic infarct due to likely cryptogenic embolic source.   She was advised to continue aspirin  81 mg daily.  Recommended 30-day outpatient cardiac monitor.  Consider loop recorder.  Her A1c was 11.  She was a poor historian with poor insight into her condition.  Underlying cognitive impairment.  Her exam was significant for cognitive impairment along with vertical gaze palsy. Today her [facility] 8/30. She is living at home. Has caregiver a few days a week. She never wore cardiac monitor. Remains on aspirin  81 mg daily, Lipitor  80 mg daily. BP 133/89. She is no longer driving. Goes to [facility], sees PA there. Uses walker to get around, no falls. Since stroke, has some double vision. Last A1C was a 10.   HISTORY 05/01/22 Dr. Margaret: 73 year old female here for evaluation of stroke. History of diabetes, hypertension, hyperlipidemia, third grade education, widowed, previously living alone. Patient was found by a friend with slurred speech and vision changes. Patient was taken to the hospital and diagnosed with left lambing stroke. Stroke work-up was completed. Some cognitive deficits were noted, which may have been present before the stroke and worsened since that time. Patient was discharged to inpatient rehabilitation. She was recommended to transition to assisted living but patient declined. Patient was able to move in with a friend temporarily. Now patient is planning to move back to her home. Friend will continue to be involved to help administer insulin  and check on daily basis.    REVIEW OF SYSTEMS: Out of a complete 14 system review of symptoms, the patient complains only  of the following symptoms, and all other reviewed systems are negative.  See HPI  ALLERGIES: No Known Allergies  HOME MEDICATIONS: Outpatient Medications Prior to Visit  Medication Sig Dispense Refill   ACCU-CHEK AVIVA PLUS test strip Every morning     aspirin  EC 81 MG EC tablet Take 1 tablet (81 mg total) by mouth daily. Swallow whole. 30 tablet 11   atorvastatin  (LIPITOR ) 80 MG  tablet Take 1 tablet (80 mg total) by mouth daily. 30 tablet 0   citalopram (CELEXA) 40 MG tablet Take 40 mg by mouth at bedtime.     cloNIDine (CATAPRES) 0.2 MG tablet Take 0.2 mg by mouth at bedtime.     Continuous Glucose Sensor (FREESTYLE LIBRE 2 SENSOR) MISC      donepezil (ARICEPT) 10 MG tablet Take 10 mg by mouth daily.     doxepin (SINEQUAN) 25 MG capsule Take 25 mg by mouth at bedtime.     gabapentin (NEURONTIN) 300 MG capsule Take 300 mg by mouth 3 (three) times daily.     LANTUS  SOLOSTAR 100 UNIT/ML Solostar Pen SMARTSIG:24 Unit(s) SUB-Q Daily     NIFEdipine (PROCARDIA-XL/NIFEDICAL-XL) 30 MG 24 hr tablet Take 30 mg by mouth at bedtime.     pantoprazole (PROTONIX) 20 MG tablet Take 20 mg by mouth at bedtime.     propranolol ER (INDERAL LA) 80 MG 24 hr capsule Take 80 mg by mouth at bedtime.     RYBELSUS 14 MG TABS Take 1 tablet by mouth every morning.     temazepam (RESTORIL) 30 MG capsule Take 30 mg by mouth at bedtime.     TRADJENTA 5 MG TABS tablet Take 5 mg by mouth daily.     No facility-administered medications prior to visit.    PAST MEDICAL HISTORY: Past Medical History:  Diagnosis Date   Arthritis    Cataract    Depression    Diabetes mellitus without complication (HCC)    Hyperlipidemia    Hypertension     PAST SURGICAL HISTORY: History reviewed. No pertinent surgical history.  FAMILY HISTORY: History reviewed. No pertinent family history.  SOCIAL HISTORY: Social History   Socioeconomic History   Marital status: Widowed    Spouse name: Not on file   Number of children: 0   Years of education: Not on file   Highest education level: 3rd grade  Occupational History   Not on file  Tobacco Use   Smoking status: Never   Smokeless tobacco: Never  Substance and Sexual Activity   Alcohol use: No   Drug use: No   Sexual activity: Not Currently  Other Topics Concern   Not on file  Social History Narrative   Not on file   Social Drivers of Health    Tobacco Use: Low Risk (11/30/2024)   Patient History    Smoking Tobacco Use: Never    Smokeless Tobacco Use: Never    Passive Exposure: Not on file  Financial Resource Strain: Not on file  Food Insecurity: Not on file  Transportation Needs: Not on file  Physical Activity: Not on file  Stress: Not on file  Social Connections: Not on file  Intimate Partner Violence: Not on file  Depression (EYV7-0): Not on file  Alcohol Screen: Not on file  Housing: Not on file  Utilities: Not on file  Health Literacy: Not on file   PHYSICAL EXAM  Vitals:   11/30/24 0733  BP: 113/78  Pulse: 73  SpO2: 96%  Weight: 198 lb (89.8  kg)  Height: 5' 6 (1.676 m)    Body mass index is 31.96 kg/m.    04/01/2023    3:40 PM  Montreal Cognitive Assessment   Visuospatial/ Executive (0/5) 2  Naming (0/3) 1  Attention: Read list of digits (0/2) 0  Attention: Read list of letters (0/1) 0  Attention: Serial 7 subtraction starting at 100 (0/3) 0  Language: Repeat phrase (0/2) 0  Language : Fluency (0/1) 0  Abstraction (0/2) 1  Delayed Recall (0/5) 0  Orientation (0/6) 4  Total 8   Generalized: Well developed, in no acute distress, elderly female Neurological examination  Mentation: Alert oriented, cooperative, patient is very pleasant, speech is clear Cranial nerve II-XII: Pupils were equal round reactive to light, sluggish. Right hypotropia, trouble with vertical gaze  Facial sensation and strength were normal.  Head turning and shoulder shrug  were normal and symmetric. Motor: The motor testing reveals 5 over 5 strength of all 4 extremities. Good symmetric motor tone is noted throughout.  Sensory: Sensory testing is intact to soft touch on all 4 extremities. No evidence of extinction is noted.  Coordination: Cerebellar testing reveals good finger-nose-finger and heel-to-shin bilaterally.  Gait and station: Gait is wide-based, cautious Reflexes: Deep tendon reflexes are symmetric and normal    DIAGNOSTIC DATA (LABS, IMAGING, TESTING) - I reviewed patient records, labs, notes, testing and imaging myself where available.  Lab Results  Component Value Date   WBC 5.6 03/14/2022   HGB 12.4 03/14/2022   HCT 36.6 03/14/2022   MCV 88.8 03/14/2022   PLT 202 03/14/2022      Component Value Date/Time   NA 139 03/14/2022 1021   K 3.3 (L) 03/14/2022 1021   CL 108 03/14/2022 1021   CO2 22 03/14/2022 1021   GLUCOSE 264 (H) 03/14/2022 1021   BUN <5 (L) 03/14/2022 1021   CREATININE 1.01 (H) 03/14/2022 1021   CALCIUM  8.0 (L) 03/14/2022 1021   PROT 5.6 (L) 03/14/2022 1021   ALBUMIN 3.0 (L) 03/14/2022 1021   AST 16 03/14/2022 1021   ALT 16 03/14/2022 1021   ALKPHOS 148 (H) 03/14/2022 1021   BILITOT 1.0 03/14/2022 1021   GFRNONAA 60 (L) 03/14/2022 1021   Lab Results  Component Value Date   CHOL 126 03/13/2022   HDL 36 (L) 03/13/2022   LDLCALC 60 03/13/2022   TRIG 150 (H) 03/13/2022   CHOLHDL 3.5 03/13/2022   Lab Results  Component Value Date   HGBA1C 11.0 (H) 03/13/2022   No results found for: [facility] Lab Results  Component Value Date   TSH 1.968 03/13/2022    Lauraine Born, AGNP-C, DNP 11/30/2024, 8:18 AM [facility] [address], [address] [place], [place] [address] [phone]   "

## 2024-11-30 NOTE — Patient Instructions (Signed)
 Great to see you today! Recommend aspirin  81 mg daily for secondary stroke prevention 30-day cardiac monitor study Strict management of vascular risk factors with a goal BP less than 130/90, A1c less than 7.0, LDL less than 70 for secondary stroke prevention Keep close follow-up with primary care, return here as needed.  Thanks!!

## 2024-12-01 ENCOUNTER — Encounter: Payer: Self-pay | Admitting: *Deleted

## 2024-12-01 NOTE — Progress Notes (Signed)
 Patient enrolled for [employer] to ship a 30 day cardiac event monitor to her address on record. Letter with instructions mailed to patient.

## 2024-12-17 ENCOUNTER — Telehealth: Payer: Self-pay | Admitting: Neurology

## 2024-12-17 NOTE — Telephone Encounter (Signed)
 Pt's niece reports she and pt are having issues with the heart monitor, even after speaking with tech support.  The longest it has stayed connected is 36 hours.  Pt and niece are questioning if they can just send it back and pt not do the study any longer.

## 2024-12-17 NOTE — Telephone Encounter (Signed)
 They always have the right to turn it in if they wish to not continue. I recommend they reach out to who provided and set up monitor

## 2024-12-20 NOTE — Telephone Encounter (Signed)
 FYI-Called and spoke to pt's niece. She stated that she spoke to the office that set it up and she was told to call the provider just to inform us  that the pt will be turning it in and not continuing the study

## 2025-01-11 ENCOUNTER — Telehealth: Payer: Self-pay

## 2025-01-11 NOTE — Telephone Encounter (Signed)
 Faxed as requested
